# Patient Record
Sex: Female | Born: 1945 | Race: White | Hispanic: No | Smoking: Current every day smoker
Health system: Southern US, Community
[De-identification: ages and names within clinical notes are randomized; demographics above are authoritative.]

## PROBLEM LIST (undated history)

## (undated) DIAGNOSIS — E7800 Pure hypercholesterolemia, unspecified: Secondary | ICD-10-CM

## (undated) DIAGNOSIS — M1990 Unspecified osteoarthritis, unspecified site: Secondary | ICD-10-CM

## (undated) DIAGNOSIS — Z8719 Personal history of other diseases of the digestive system: Secondary | ICD-10-CM

## (undated) DIAGNOSIS — K219 Gastro-esophageal reflux disease without esophagitis: Secondary | ICD-10-CM

## (undated) DIAGNOSIS — I10 Essential (primary) hypertension: Secondary | ICD-10-CM

## (undated) DIAGNOSIS — J45909 Unspecified asthma, uncomplicated: Secondary | ICD-10-CM

## (undated) DIAGNOSIS — J189 Pneumonia, unspecified organism: Secondary | ICD-10-CM

## (undated) DIAGNOSIS — H269 Unspecified cataract: Secondary | ICD-10-CM

## (undated) DIAGNOSIS — E119 Type 2 diabetes mellitus without complications: Secondary | ICD-10-CM

## (undated) HISTORY — PX: ABDOMINAL HYSTERECTOMY: SHX81

## (undated) HISTORY — PX: CHOLECYSTECTOMY: SHX55

## (undated) HISTORY — PX: ORIF ANKLE FRACTURE: SUR919

---
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
## 2018-07-10 DIAGNOSIS — F411 Generalized anxiety disorder: Secondary | ICD-10-CM | POA: Insufficient documentation

## 2018-07-10 DIAGNOSIS — K219 Gastro-esophageal reflux disease without esophagitis: Secondary | ICD-10-CM | POA: Insufficient documentation

## 2018-07-10 DIAGNOSIS — J301 Allergic rhinitis due to pollen: Secondary | ICD-10-CM | POA: Insufficient documentation

## 2018-07-10 DIAGNOSIS — M05712 Rheumatoid arthritis with rheumatoid factor of left shoulder without organ or systems involvement: Secondary | ICD-10-CM | POA: Insufficient documentation

## 2018-07-10 DIAGNOSIS — Z8711 Personal history of peptic ulcer disease: Secondary | ICD-10-CM | POA: Insufficient documentation

## 2018-07-10 DIAGNOSIS — N3281 Overactive bladder: Secondary | ICD-10-CM | POA: Insufficient documentation

## 2018-07-10 DIAGNOSIS — K449 Diaphragmatic hernia without obstruction or gangrene: Secondary | ICD-10-CM | POA: Insufficient documentation

## 2018-07-10 DIAGNOSIS — E119 Type 2 diabetes mellitus without complications: Secondary | ICD-10-CM | POA: Insufficient documentation

## 2018-07-10 DIAGNOSIS — I10 Essential (primary) hypertension: Secondary | ICD-10-CM | POA: Insufficient documentation

## 2018-07-10 DIAGNOSIS — E7800 Pure hypercholesterolemia, unspecified: Secondary | ICD-10-CM | POA: Insufficient documentation

## 2018-07-10 DIAGNOSIS — M05711 Rheumatoid arthritis with rheumatoid factor of right shoulder without organ or systems involvement: Secondary | ICD-10-CM | POA: Insufficient documentation

## 2018-07-10 DIAGNOSIS — J4540 Moderate persistent asthma, uncomplicated: Secondary | ICD-10-CM | POA: Insufficient documentation

## 2018-08-29 DIAGNOSIS — Z79899 Other long term (current) drug therapy: Secondary | ICD-10-CM | POA: Insufficient documentation

## 2018-08-29 DIAGNOSIS — G8929 Other chronic pain: Secondary | ICD-10-CM | POA: Insufficient documentation

## 2018-09-14 DIAGNOSIS — G8929 Other chronic pain: Secondary | ICD-10-CM | POA: Insufficient documentation

## 2018-12-28 ENCOUNTER — Other Ambulatory Visit: Payer: Self-pay | Admitting: Podiatry

## 2018-12-28 DIAGNOSIS — D489 Neoplasm of uncertain behavior, unspecified: Secondary | ICD-10-CM

## 2019-01-04 ENCOUNTER — Other Ambulatory Visit: Payer: Self-pay

## 2019-01-04 ENCOUNTER — Ambulatory Visit
Admission: RE | Admit: 2019-01-04 | Discharge: 2019-01-04 | Disposition: A | Discharge status: Home / Self Care | Payer: Medicare HMO | Source: Ambulatory Visit | Attending: Podiatry | Admitting: Podiatry

## 2019-01-04 DIAGNOSIS — D489 Neoplasm of uncertain behavior, unspecified: Secondary | ICD-10-CM | POA: Diagnosis not present

## 2019-01-04 HISTORY — DX: Type 2 diabetes mellitus without complications: E11.9

## 2019-01-04 HISTORY — DX: Essential (primary) hypertension: I10

## 2019-01-04 IMAGING — MR MR FOOT*L* WO/W CM
10 series · 40 of 40 positions shown · IV contrast (9ml Gadavist)
Comparison: None.

CLINICAL DATA: Enlarging palpable nodules along the plantar aspect
of the foot for the past year. Patient reports rheumatoid nodules.
History of little toe surgery. No acute injury.

EXAM:
MRI OF THE LEFT FOREFOOT WITHOUT AND WITH CONTRAST
TECHNIQUE: Multiplanar, multisequence MR imaging of the left foot was performed
both before and after administration of intravenous contrast.
CONTRAST:  9mL GADAVIST GADOBUTROL 1 MMOL/ML IV SOLN

[Series 6: T2 fat-sat · coronal · left · 3.0mm · 0.47mm/px · 5 of 64 slices shown (1 of 2)]
[im 1/64]
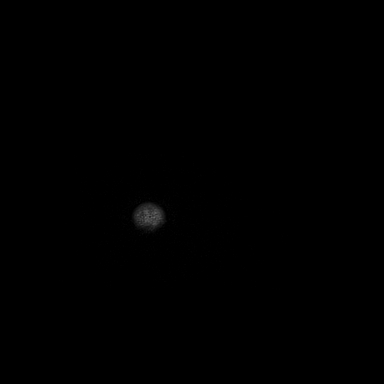
[im 16/64]
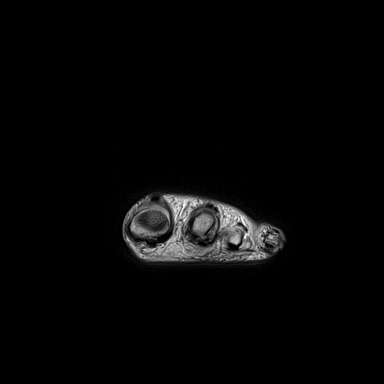
[im 32/64]
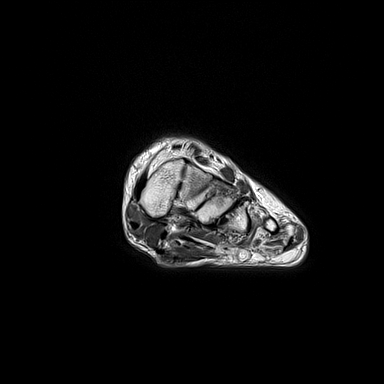
[im 48/64]
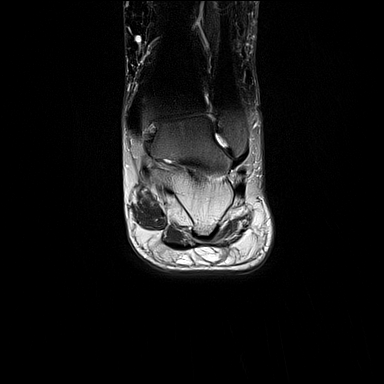
[im 64/64]
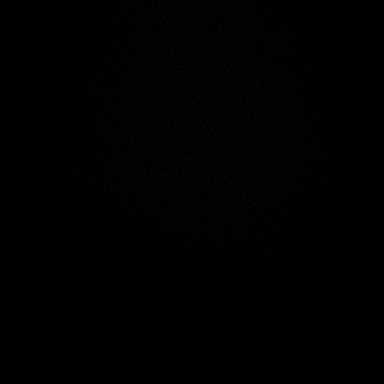

[Series 7: T1 · axial · left · 3.0mm · 0.68mm/px · z∈[-104,+11]mm · 2 of 30 slices shown (1 of 2)]
[im 1/30]
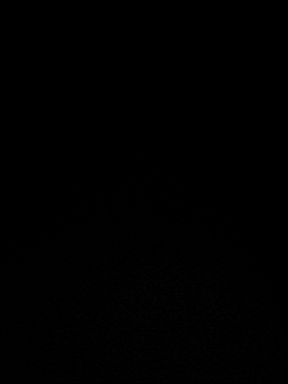
[im 30/30]
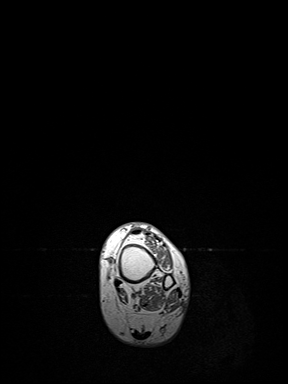

[Series 8: T1 · coronal · left · 3.0mm · 0.47mm/px · 5 of 64 slices shown (2 of 2)]
[im 1/64]
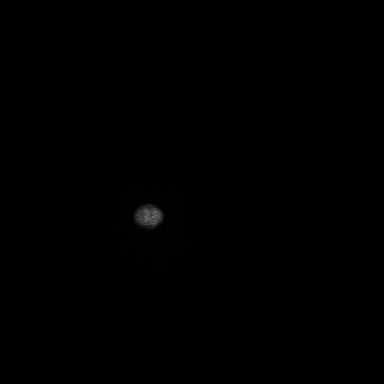
[im 16/64]
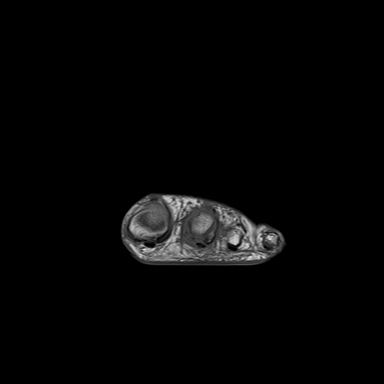
[im 32/64]
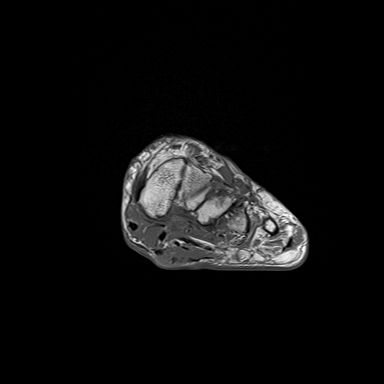
[im 48/64]
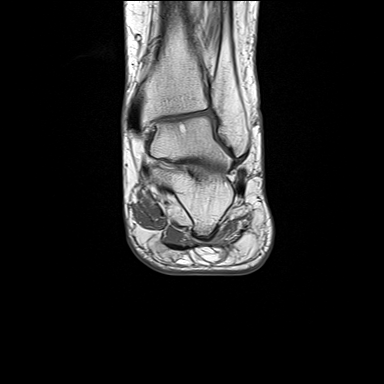
[im 64/64]
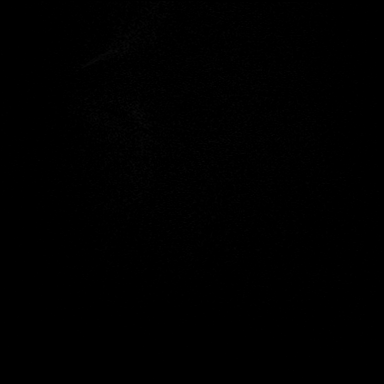

[Series 9: T2 fat-sat · axial · left · 3.0mm · 0.77mm/px · z∈[-104,+11]mm · 3 of 30 slices shown (2 of 2)]
[im 1/30]
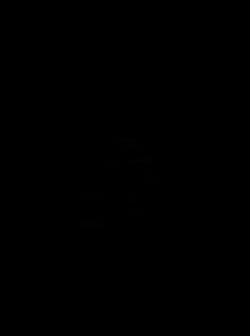
[im 15/30]
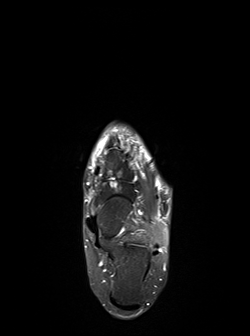
[im 30/30]
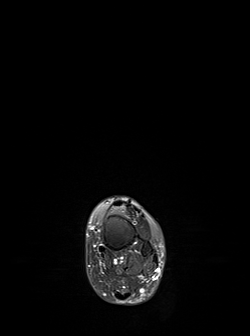

[Series 10: T1 fat-sat · coronal · non-contrast · left · 3.0mm · 0.59mm/px · 6 of 64 slices shown (1 of 3)]
[im 1/64]
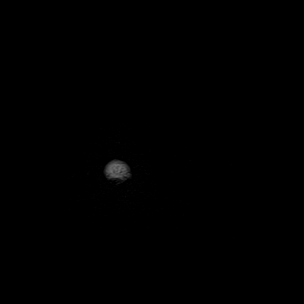
[im 13/64]
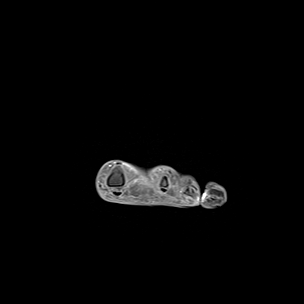
[im 26/64]
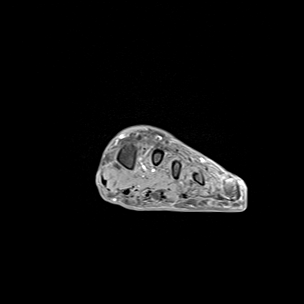
[im 38/64]
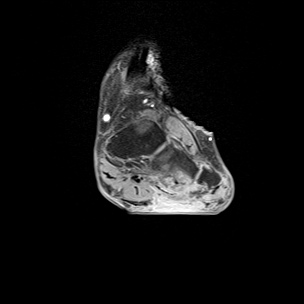
[im 51/64]
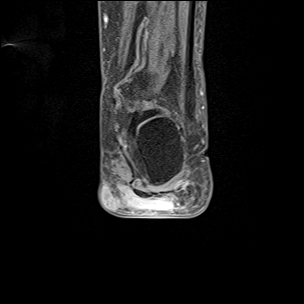
[im 64/64]
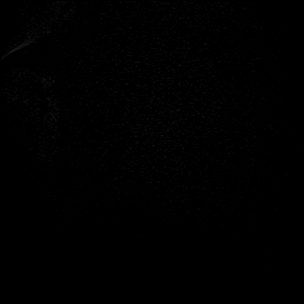

[Series 11: STIR · sagittal · left · 3.0mm · 0.90mm/px · 2 of 27 slices shown (1 of 2)]
[im 1/27]
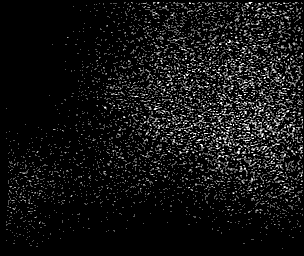
[im 27/27]
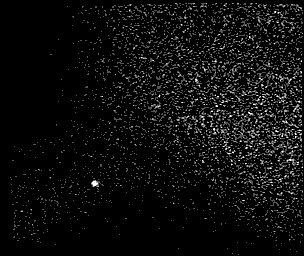

[Series 12: STIR · coronal · left · 3.0mm · 0.70mm/px · 6 of 64 slices shown (2 of 2)]
[im 1/64]
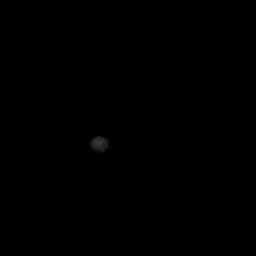
[im 13/64]
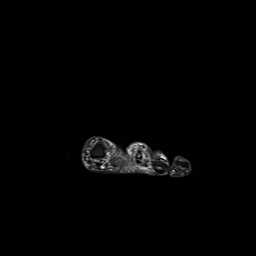
[im 26/64]
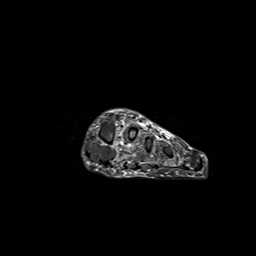
[im 38/64]
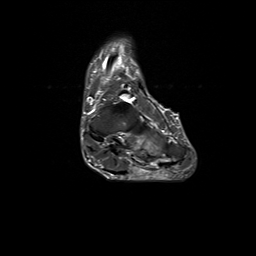
[im 51/64]
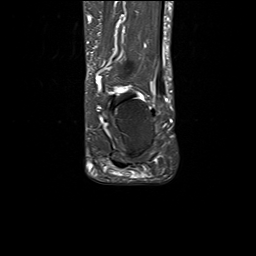
[im 64/64]
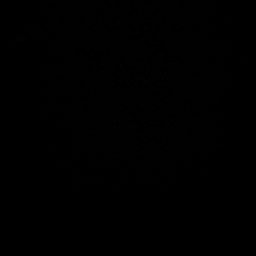

[Series 13: T1 fat-sat post-contrast · coronal · left · 3.0mm · 0.59mm/px · 6 of 64 slices shown]
[im 1/64]
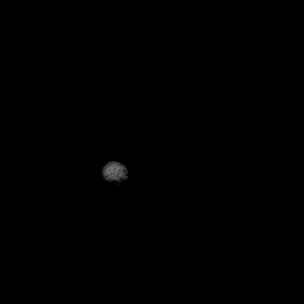
[im 13/64]
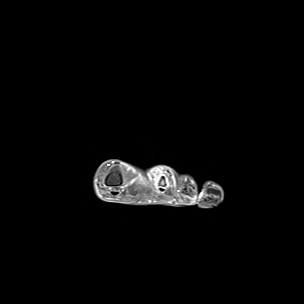
[im 26/64]
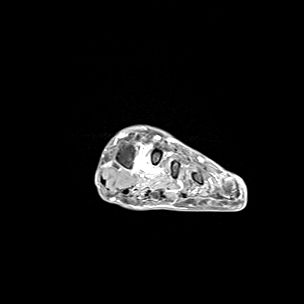
[im 38/64]
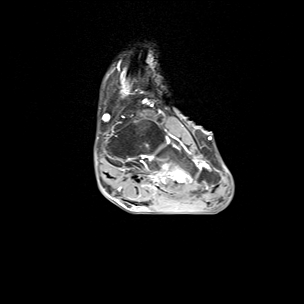
[im 51/64]
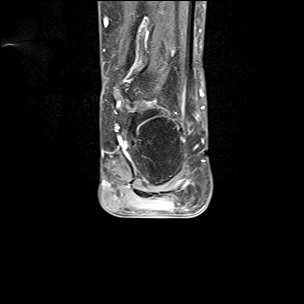
[im 64/64]
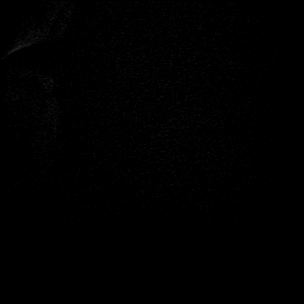

[Series 14: T1 fat-sat · axial · left · 3.0mm · 0.81mm/px · z∈[-104,+11]mm · 3 of 30 slices shown (2 of 3)]
[im 1/30]
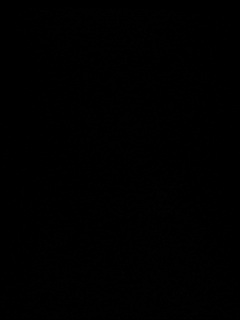
[im 15/30]
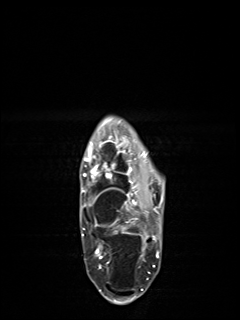
[im 30/30]
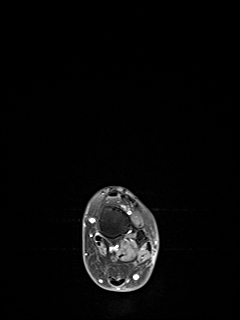

[Series 15: T1 fat-sat · sagittal · left · 3.0mm · 0.76mm/px · 2 of 27 slices shown (3 of 3)]
[im 1/27]
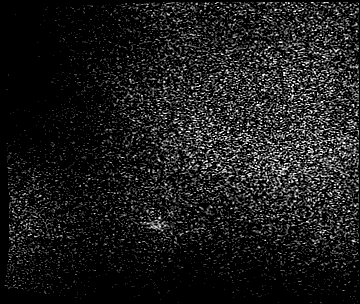
[im 27/27]
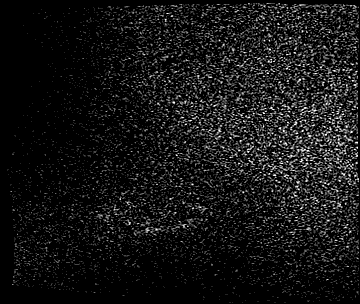

[40 of 40 positions shown; findings below may reference images not displayed]

FINDINGS: Bones/Joint/Cartilage

The study includes the entire foot and ankle. The ankle and hindfoot
appear unremarkable. Advanced arthropathic changes are present
throughout the midfoot with subchondral edema, enhancement and cyst
formation throughout the cuneiform bones, cuboid and metatarsal
bases. There is surrounding synovial enhancement. In addition, there
is focal arthropathy at the 2nd metatarsophalangeal joint with
marrow edema and enhancement in the 2nd metatarsal head and proximal
phalangeal base. There is prominent synovial and surrounding soft
tissue enhancement at that joint. The additional metatarsophalangeal
and interphalangeal joints appear normal.

Ligaments

The Lisfranc ligament is intact. The collateral ligaments of the
metatarsophalangeal joints appear intact.

Muscles and Tendons

There is mild peroneal tenosynovitis with synovial enhancement
following contrast. More advanced tendinosis and tenosynovitis of
the tibialis anterior tendon is demonstrated, best seen on the
postcontrast axial images (images 9 through 12 of series 14). The
additional ankle tendons appear normal. The forefoot tendons and
muscles appear normal.

Soft tissues

There are multiple infiltrating subcutaneous masses within the
plantar subcutaneous fat of the foot. These are most clearly
demonstrated on the T1 weighted images and are nearly isointense to
muscle on those sequences. The lesions demonstrate heterogeneous T2
signal and variable but predominantly low level enhancement
following contrast. Plantar to the calcaneus, there is a lesion
measuring 4.5 x 4.6 cm on image [DATE]. Plantar to the Lisfranc joint,
there is a lesion measuring 3.8 x 2.7 cm on image [DATE]. There is an
infiltrating lesion medial to this which involves the distal aspect
of the plantar fascia and shows more intense enhancement following
contrast. Other smaller lesions are present plantar to the 5th
metatarsophalangeal joint, the distal phalanx of the great toe and
the distal phalanx of the 2nd toe. The subcutaneous lesions do not
appear to be associated with any osseous erosion.
IMPRESSION: 1. Multiple subcutaneous nodules in left foot with homogeneous T1
signal, heterogeneous T2 signal and predominately low level
enhancement following contrast. These are most compatible with
rheumatoid nodules. In addition, there may be plantar fibromatosis.
2. Advanced midfoot arthropathy, likely rheumatoid arthritis. A
neuropathic joint could have this appearance.
3. Additional arthropathy at the 2nd metatarsophalangeal joint with
prominent synovial enhancement.
4. Tenosynovitis involving the peroneal and tibialis anterior
tendons.

## 2019-01-04 MED ORDER — GADOBUTROL 1 MMOL/ML IV SOLN
9.0000 mL | Freq: Once | INTRAVENOUS | Status: AC | PRN
  Administered 2019-01-04: 9 mL via INTRAVENOUS

## 2019-01-19 DIAGNOSIS — H269 Unspecified cataract: Secondary | ICD-10-CM

## 2019-01-19 HISTORY — DX: Unspecified cataract: H26.9

## 2019-01-31 ENCOUNTER — Encounter: Payer: Self-pay | Admitting: *Deleted

## 2019-02-13 ENCOUNTER — Other Ambulatory Visit: Admission: RE | Admit: 2019-02-13 | Payer: Medicare HMO | Source: Ambulatory Visit

## 2019-02-14 ENCOUNTER — Other Ambulatory Visit
Admission: RE | Admit: 2019-02-14 | Discharge: 2019-02-14 | Disposition: A | Discharge status: Home / Self Care | Payer: Medicare HMO | Source: Ambulatory Visit | Attending: Ophthalmology | Admitting: Ophthalmology

## 2019-02-14 ENCOUNTER — Other Ambulatory Visit: Payer: Self-pay

## 2019-02-14 DIAGNOSIS — Z01812 Encounter for preprocedural laboratory examination: Secondary | ICD-10-CM | POA: Diagnosis present

## 2019-02-14 DIAGNOSIS — Z20828 Contact with and (suspected) exposure to other viral communicable diseases: Secondary | ICD-10-CM | POA: Insufficient documentation

## 2019-02-14 LAB — SARS CORONAVIRUS 2 (TAT 6-24 HRS): SARS Coronavirus 2: NEGATIVE

## 2019-02-16 ENCOUNTER — Ambulatory Visit: Payer: Medicare HMO | Admitting: Certified Registered Nurse Anesthetist

## 2019-02-16 ENCOUNTER — Encounter
Admission: RE | Disposition: A | Discharge status: Home / Self Care | Payer: Self-pay | Source: Home / Self Care | Attending: Ophthalmology

## 2019-02-16 ENCOUNTER — Ambulatory Visit
Admission: RE | Admit: 2019-02-16 | Discharge: 2019-02-16 | Disposition: A | Discharge status: Home / Self Care | Payer: Medicare HMO | Attending: Ophthalmology | Admitting: Ophthalmology

## 2019-02-16 ENCOUNTER — Encounter: Payer: Self-pay | Admitting: *Deleted

## 2019-02-16 ENCOUNTER — Other Ambulatory Visit: Payer: Self-pay

## 2019-02-16 DIAGNOSIS — Z791 Long term (current) use of non-steroidal anti-inflammatories (NSAID): Secondary | ICD-10-CM | POA: Diagnosis not present

## 2019-02-16 DIAGNOSIS — M1990 Unspecified osteoarthritis, unspecified site: Secondary | ICD-10-CM | POA: Insufficient documentation

## 2019-02-16 DIAGNOSIS — Z881 Allergy status to other antibiotic agents status: Secondary | ICD-10-CM | POA: Insufficient documentation

## 2019-02-16 DIAGNOSIS — I10 Essential (primary) hypertension: Secondary | ICD-10-CM | POA: Diagnosis not present

## 2019-02-16 DIAGNOSIS — Z7951 Long term (current) use of inhaled steroids: Secondary | ICD-10-CM | POA: Diagnosis not present

## 2019-02-16 DIAGNOSIS — Z880 Allergy status to penicillin: Secondary | ICD-10-CM | POA: Diagnosis not present

## 2019-02-16 DIAGNOSIS — Z79899 Other long term (current) drug therapy: Secondary | ICD-10-CM | POA: Insufficient documentation

## 2019-02-16 DIAGNOSIS — J45909 Unspecified asthma, uncomplicated: Secondary | ICD-10-CM | POA: Insufficient documentation

## 2019-02-16 DIAGNOSIS — M810 Age-related osteoporosis without current pathological fracture: Secondary | ICD-10-CM | POA: Diagnosis not present

## 2019-02-16 DIAGNOSIS — F419 Anxiety disorder, unspecified: Secondary | ICD-10-CM | POA: Diagnosis not present

## 2019-02-16 DIAGNOSIS — H2511 Age-related nuclear cataract, right eye: Secondary | ICD-10-CM | POA: Insufficient documentation

## 2019-02-16 DIAGNOSIS — E7800 Pure hypercholesterolemia, unspecified: Secondary | ICD-10-CM | POA: Diagnosis not present

## 2019-02-16 DIAGNOSIS — F17200 Nicotine dependence, unspecified, uncomplicated: Secondary | ICD-10-CM | POA: Diagnosis not present

## 2019-02-16 DIAGNOSIS — E1136 Type 2 diabetes mellitus with diabetic cataract: Secondary | ICD-10-CM | POA: Diagnosis not present

## 2019-02-16 HISTORY — PX: CATARACT EXTRACTION W/PHACO: SHX586

## 2019-02-16 HISTORY — DX: Pneumonia, unspecified organism: J18.9

## 2019-02-16 HISTORY — DX: Personal history of other diseases of the digestive system: Z87.19

## 2019-02-16 HISTORY — DX: Unspecified asthma, uncomplicated: J45.909

## 2019-02-16 HISTORY — DX: Gastro-esophageal reflux disease without esophagitis: K21.9

## 2019-02-16 HISTORY — DX: Pure hypercholesterolemia, unspecified: E78.00

## 2019-02-16 HISTORY — DX: Unspecified osteoarthritis, unspecified site: M19.90

## 2019-02-16 LAB — GLUCOSE, CAPILLARY
Glucose-Capillary: 133 mg/dL — ABNORMAL HIGH (ref 70–99)
Glucose-Capillary: 132 mg/dL — ABNORMAL HIGH (ref 70–99)

## 2019-02-16 SURGERY — PHACOEMULSIFICATION, CATARACT, WITH IOL INSERTION
Anesthesia: Monitor Anesthesia Care | Site: Eye | Laterality: Right

## 2019-02-16 MED ORDER — TRYPAN BLUE 0.06 % OP SOLN
OPHTHALMIC | Status: DC | PRN
  Administered 2019-02-16: 0.5 mL via INTRAOCULAR

## 2019-02-16 MED ORDER — ARMC OPHTHALMIC DILATING GEL
1.0000 "application " | OPHTHALMIC | Status: AC
  Administered 2019-02-16 (×3): 1 via OPHTHALMIC
  Filled 2019-02-16: qty 0.25

## 2019-02-16 MED ORDER — TETRACAINE HCL 0.5 % OP SOLN
1.0000 [drp] | Freq: Once | OPHTHALMIC | Status: AC
  Administered 2019-02-16 (×2): 1 [drp] via OPHTHALMIC

## 2019-02-16 MED ORDER — BUPIVACAINE HCL (PF) 0.5 % IJ SOLN
INTRAMUSCULAR | Status: AC
  Filled 2019-02-16: qty 30

## 2019-02-16 MED ORDER — DEXMEDETOMIDINE HCL 200 MCG/2ML IV SOLN
INTRAVENOUS | Status: DC | PRN
  Administered 2019-02-16: 4 ug via INTRAVENOUS
  Administered 2019-02-16: 8 ug via INTRAVENOUS

## 2019-02-16 MED ORDER — GELATIN ABSORBABLE 12-7 MM EX MISC
CUTANEOUS | Status: AC
  Filled 2019-02-16: qty 1

## 2019-02-16 MED ORDER — SODIUM CHLORIDE 0.9 % IV SOLN
INTRAVENOUS | Status: DC
  Administered 2019-02-16 (×2): via INTRAVENOUS

## 2019-02-16 MED ORDER — MIDAZOLAM HCL 2 MG/2ML IJ SOLN
INTRAMUSCULAR | Status: AC
  Filled 2019-02-16: qty 2

## 2019-02-16 MED ORDER — LIDOCAINE HCL (PF) 4 % IJ SOLN
INTRAOCULAR | Status: DC | PRN
  Administered 2019-02-16: 4 mL via OPHTHALMIC

## 2019-02-16 MED ORDER — NA HYALUR & NA CHOND-NA HYALUR 0.55-0.5 ML IO KIT
PACK | INTRAOCULAR | Status: DC | PRN
  Administered 2019-02-16: 1 via INTRAOCULAR

## 2019-02-16 MED ORDER — ONDANSETRON HCL 4 MG/2ML IJ SOLN
INTRAMUSCULAR | Status: DC | PRN
  Administered 2019-02-16: 4 mg via INTRAVENOUS

## 2019-02-16 MED ORDER — EPINEPHRINE PF 1 MG/ML IJ SOLN
INTRAOCULAR | Status: DC | PRN
  Administered 2019-02-16: 08:00:00 via OPHTHALMIC

## 2019-02-16 MED ORDER — ARMC OPHTHALMIC DILATING DROPS
OPHTHALMIC | Status: AC
  Filled 2019-02-16: qty 0.5

## 2019-02-16 MED ORDER — PROPOFOL 10 MG/ML IV BOLUS
INTRAVENOUS | Status: AC
  Filled 2019-02-16: qty 20

## 2019-02-16 MED ORDER — FENTANYL CITRATE (PF) 100 MCG/2ML IJ SOLN
INTRAMUSCULAR | Status: DC | PRN
  Administered 2019-02-16 (×4): 25 ug via INTRAVENOUS

## 2019-02-16 MED ORDER — NEOMYCIN-POLYMYXIN B GU 40-200000 IR SOLN
Status: AC
  Filled 2019-02-16: qty 1

## 2019-02-16 MED ORDER — POVIDONE-IODINE 5 % OP SOLN
OPHTHALMIC | Status: DC | PRN
  Administered 2019-02-16: 1 via OPHTHALMIC

## 2019-02-16 MED ORDER — FENTANYL CITRATE (PF) 100 MCG/2ML IJ SOLN
INTRAMUSCULAR | Status: AC
  Filled 2019-02-16: qty 2

## 2019-02-16 MED ORDER — POLYMYXIN B-TRIMETHOPRIM 10000-0.1 UNIT/ML-% OP SOLN: 1.0000 [drp] | Freq: Once | OPHTHALMIC | Status: DC

## 2019-02-16 MED ORDER — POLYMYXIN B-TRIMETHOPRIM 10000-0.1 UNIT/ML-% OP SOLN
OPHTHALMIC | Status: AC
  Filled 2019-02-16: qty 10

## 2019-02-16 MED ORDER — POLYMYXIN B-TRIMETHOPRIM 10000-0.1 UNIT/ML-% OP SOLN
OPHTHALMIC | Status: DC | PRN
  Administered 2019-02-16: 2 [drp]

## 2019-02-16 MED ORDER — NA CHONDROIT SULF-NA HYALURON 40-17 MG/ML IO SOLN
INTRAOCULAR | Status: DC | PRN
  Administered 2019-02-16: 1 mL via INTRAOCULAR

## 2019-02-16 MED ORDER — CARBACHOL 0.01 % IO SOLN
INTRAOCULAR | Status: DC | PRN
  Administered 2019-02-16: 0.5 mL via INTRAOCULAR

## 2019-02-16 MED ORDER — TETRACAINE HCL 0.5 % OP SOLN
OPHTHALMIC | Status: AC
  Administered 2019-02-16: 07:00:00 1 [drp] via OPHTHALMIC
  Filled 2019-02-16: qty 4

## 2019-02-16 SURGICAL SUPPLY — 17 items
DISSECTOR HYDRO NUCLEUS 50X22 (MISCELLANEOUS) ×8 IMPLANT
DRSG TEGADERM 2-3/8X2-3/4 SM (GAUZE/BANDAGES/DRESSINGS) ×2 IMPLANT
GLOVE BIOGEL M 6.5 STRL (GLOVE) ×2 IMPLANT
GOWN STRL REUS W/ TWL LRG LVL3 (GOWN DISPOSABLE) ×1 IMPLANT
GOWN STRL REUS W/ TWL XL LVL3 (GOWN DISPOSABLE) ×1 IMPLANT
GOWN STRL REUS W/TWL LRG LVL3 (GOWN DISPOSABLE) ×1
GOWN STRL REUS W/TWL XL LVL3 (GOWN DISPOSABLE) ×1
KNIFE 45D UP 2.3 (MISCELLANEOUS) ×2 IMPLANT
LABEL CATARACT MEDS ST (LABEL) ×2 IMPLANT
LENS IOL TECNIS ITEC 22.5 (Intraocular Lens) ×1 IMPLANT
PACK CATARACT (MISCELLANEOUS) ×2 IMPLANT
PACK CATARACT KING (MISCELLANEOUS) ×2 IMPLANT
PACK EYE AFTER SURG (MISCELLANEOUS) ×2 IMPLANT
SOL BSS BAG (MISCELLANEOUS) ×2
SOLUTION BSS BAG (MISCELLANEOUS) ×1 IMPLANT
WATER STERILE IRR 250ML POUR (IV SOLUTION) ×2 IMPLANT
WIPE NON LINTING 3.25X3.25 (MISCELLANEOUS) ×2 IMPLANT

## 2019-02-16 NOTE — Anesthesia Post-op Follow-up Note (Signed)
Anesthesia QCDR form completed.        

## 2019-02-16 NOTE — Transfer of Care (Signed)
Immediate Anesthesia Transfer of Care Note  Patient: Taylor George  Procedure(s) Performed: CATARACT EXTRACTION PHACO AND INTRAOCULAR LENS PLACEMENT ([place]), RIGHT, (Right Eye)  Patient Location: PACU  Anesthesia Type:MAC  Level of Consciousness: awake, alert  and oriented  Airway & Oxygen Therapy: Patient Spontanous Breathing and Patient connected to nasal cannula oxygen  Post-op Assessment: Report given to RN and Post -op Vital signs reviewed and stable  Post vital signs: Reviewed and stable  Last Vitals:  Vitals Value Taken Time  BP    Temp    Pulse    Resp    SpO2      Last Pain:  Vitals:   02/16/19 0618  TempSrc: Tympanic  PainSc: 0-No pain         Complications: No apparent anesthesia complications

## 2019-02-16 NOTE — H&P (Signed)
   I have reviewed the patient's H&P and agree with its findings. There have been no interval changes.  Tyrina Hines MD Ophthalmology 

## 2019-02-16 NOTE — Discharge Instructions (Addendum)
Eye Surgery Discharge Instructions  Expect mild scratchy sensation or mild soreness. DO NOT RUB YOUR EYE!  The day of surgery:  Minimal physical activity, but bed rest is not required  No reading, computer work, or close hand work  No bending, lifting, or straining.  May watch TV  For 24 hours:  No driving, legal decisions, or alcoholic beverages  Safety precautions  Eat anything you prefer: It is better to start with liquids, then soup then solid foods.  Solar shield eyeglasses should be worn for comfort in the sunlight/patch while sleeping  Resume all regular medications including aspirin or Coumadin if these were discontinued prior to surgery. You may shower, bathe, shave, or wash your hair. Tylenol may be taken for mild discomfort. Follow eye drop instruction sheet as reviewed.  Call your doctor if you experience significant pain, nausea, or vomiting, fever > 101 or other signs of infection. [phone] or [phone] Specific instructions:  Follow-up Information    Marchia Meiers, MD Follow up.   Specialty: Ophthalmology Why: 02-17-19 @ 10:45 am Contact information: [place] [address] [phone]

## 2019-02-16 NOTE — Op Note (Signed)
  PREOPERATIVE DIAGNOSIS:  Nuclear sclerotic cataract of the RIGHT eye.   POSTOPERATIVE DIAGNOSIS:  Nuclear sclerotic cataract of the RIGHT eye.   OPERATIVE PROCEDURE: Cataract surgery OD   SURGEON:  Marchia Meiers, MD.   ANESTHESIA:  Anesthesiologist: Alphonsus Sias, MD CRNA: Willette Alma, CRNA  1.      Managed anesthesia care. 2.     0.50ml of Shugarcaine was instilled following the paracentesis   COMPLICATIONS:  None.   TECHNIQUE:   Divide and conquer   DESCRIPTION OF PROCEDURE:  The patient was examined and consented in the preoperative holding area where the aforementioned topical anesthesia was applied to the RIGHT eye and then brought back to the Operating Room where the RIGHT eye was prepped and draped in the usual sterile ophthalmic fashion and a lid speculum was placed. A paracentesis was created with the side port blade, the anterior chamber was washed out with trypan blue to stain the anterior capsule, and the anterior chamber was filled with viscoelastic. A near clear corneal incision was performed with the steel keratome. A continuous curvilinear capsulorrhexis was performed with a cystotome followed by the capsulorrhexis forceps. Hydrodissection and hydrodelineation were carried out with BSS on a blunt cannula. The lens was removed in a divide and conquer  technique and the remaining cortical material was removed with the irrigation-aspiration handpiece. The capsular bag was inflated with viscoelastic and the lens was placed in the capsular bag without complication. The remaining viscoelastic was removed from the eye with the irrigation-aspiration handpiece. The wounds were hydrated. The anterior chamber was flushed and the eye was inflated to physiologic pressure. 0.32ml Vigamox was placed in the anterior chamber. The wounds were found to be water tight. The eye was dressed with Vigamox. The patient was given protective glasses to wear throughout the day and a shield with which  to sleep tonight. The patient was also given drops with which to begin a drop regimen today and will follow-up with me in one day. Implant Name Type Inv. Item Serial No. Manufacturer Lot No. LRB No. Used Action  LENS IOL DIOP 22.5 - [id number] Intraocular Lens LENS IOL DIOP 22.5 [id number] 2002 [place]  Right 1 Implanted    Procedure(s) with comments: CATARACT EXTRACTION PHACO AND INTRAOCULAR LENS PLACEMENT ([facility]), RIGHT, (Right) - [place] 00:53.8 CDE 8.61 FLUID PACK LOT # [id number]  Electronically signed: Marchia Meiers 02/16/2019 12:31 PM

## 2019-02-16 NOTE — Anesthesia Preprocedure Evaluation (Addendum)
Anesthesia Evaluation  Patient identified by MRN, date of birth, ID band Patient awake    Reviewed: Allergy & Precautions, H&P , NPO status , reviewed documented beta blocker date and time   Airway Mallampati: III  TM Distance: >3 FB Neck ROM: full    Dental  (+) Upper Dentures, Lower Dentures   Pulmonary asthma , pneumonia, resolved, Current Smoker and Patient abstained from smoking.,    Pulmonary exam normal        Cardiovascular hypertension, Normal cardiovascular exam     Neuro/Psych    GI/Hepatic hiatal hernia, GERD  Medicated and Controlled,  Endo/Other  diabetes  Renal/GU      Musculoskeletal  (+) Arthritis ,   Abdominal   Peds  Hematology   Anesthesia Other Findings Past Medical History: No date: Arthritis     Comment:  osteoporosis No date: Asthma 01/2019: Cataract     Comment:  right eye No date: Diabetes mellitus without complication (HCC) No date: GERD (gastroesophageal reflux disease)     Comment:  ulcers No date: History of hiatal hernia No date: Hypercholesteremia No date: Hypertension No date: Pneumonia     Comment:  bronchitis also, both in the past  Past Surgical History: No date: ABDOMINAL HYSTERECTOMY No date: CHOLECYSTECTOMY No date: ORIF ANKLE FRACTURE; Right     Comment:  pins foot and toes, also, ORIF RIGHT heel  BMI    Body Mass Index: 39.57 kg/m      Reproductive/Obstetrics                            Anesthesia Physical Anesthesia Plan  ASA: III  Anesthesia Plan: MAC   Post-op Pain Management:    Induction: Intravenous  PONV Risk Score and Plan: Treatment may vary due to age or medical condition and TIVA  Airway Management Planned: Nasal Cannula and Natural Airway  Additional Equipment:   Intra-op Plan:   Post-operative Plan:   Informed Consent: I have reviewed the patients History and Physical, chart, labs and discussed the  procedure including the risks, benefits and alternatives for the proposed anesthesia with the patient or authorized representative who has indicated his/her understanding and acceptance.     Dental Advisory Given  Plan Discussed with: CRNA  Anesthesia Plan Comments:         Anesthesia Quick Evaluation

## 2019-02-17 ENCOUNTER — Encounter: Payer: Self-pay | Admitting: Ophthalmology

## 2019-02-17 NOTE — Anesthesia Postprocedure Evaluation (Signed)
Anesthesia Post Note  Patient: Taylor George  Procedure(s) Performed: CATARACT EXTRACTION PHACO AND INTRAOCULAR LENS PLACEMENT ([place]), RIGHT, (Right Eye)  Patient location during evaluation: Phase II Anesthesia Type: MAC Level of consciousness: awake and alert Pain management: pain level controlled Vital Signs Assessment: post-procedure vital signs reviewed and stable Respiratory status: spontaneous breathing, nonlabored ventilation and respiratory function stable Cardiovascular status: stable and blood pressure returned to baseline Postop Assessment: no apparent nausea or vomiting Anesthetic complications: no     Last Vitals:  Vitals:   02/16/19 0618 02/16/19 0809  BP: 134/77 110/65  Pulse: 99 83  Resp: 18 16  Temp: (!) 36.3 C (!) 36.1 C  SpO2: 98% 97%    Last Pain:  Vitals:   02/16/19 0809  TempSrc: Temporal  PainSc: 0-No pain                 Alphonsus Sias

## 2019-02-20 ENCOUNTER — Other Ambulatory Visit: Payer: Self-pay

## 2019-02-21 DIAGNOSIS — Z6841 Body Mass Index (BMI) 40.0 and over, adult: Secondary | ICD-10-CM | POA: Insufficient documentation

## 2019-02-22 ENCOUNTER — Other Ambulatory Visit: Payer: Self-pay | Admitting: Family Medicine

## 2019-02-22 DIAGNOSIS — Z1231 Encounter for screening mammogram for malignant neoplasm of breast: Secondary | ICD-10-CM

## 2019-02-23 ENCOUNTER — Other Ambulatory Visit: Payer: Self-pay | Admitting: Podiatry

## 2019-02-23 NOTE — Discharge Instructions (Signed)
[facility] °[facility] ° °POST OPERATIVE INSTRUCTIONS FOR DR. TROXLER, DR. FOWLER, AND DR. BAKER °[facility] ° ° °1. Take your medication as prescribed.  Pain medication should be taken only as needed. ° °2. Keep the dressing clean, dry and intact. ° °3. Keep your foot elevated above the heart level for the first 48 hours. ° °4. Walking to the bathroom and brief periods of walking are acceptable, unless we have instructed you to be non-weight bearing. ° °5. Always wear your post-op shoe when walking.  Always use your crutches if you are to be non-weight bearing. ° °6. Do not take a shower. Baths are permissible as long as the foot is kept out of the water.  ° °7. Every hour you are awake:  °- Bend your knee 15 times. °- Flex foot 15 times °- Massage calf 15 times ° °8. Call [facility] ([phone]) if any of the following problems occur: °- You develop a temperature or fever. °- The bandage becomes saturated with blood. °- Medication does not stop your pain. °- Injury of the foot occurs. °- Any symptoms of infection including redness, odor, or red streaks running from wound. ° °General Anesthesia, Adult, Care After °This sheet gives you information about how to care for yourself after your procedure. Your health care provider may also give you more specific instructions. If you have problems or questions, contact your health care provider. °What can I expect after the procedure? °After the procedure, the following side effects are common: °· Pain or discomfort at the IV site. °· Nausea. °· Vomiting. °· Sore throat. °· Trouble concentrating. °· Feeling cold or chills. °· Weak or tired. °· Sleepiness and fatigue. °· Soreness and body aches. These side effects can affect parts of the body that were not involved in surgery. °Follow these instructions at home: ° °For at least 24 hours after the procedure: °· Have a responsible adult stay with you. It is  important to have someone help care for you until you are awake and alert. °· Rest as needed. °· Do not: °? Participate in activities in which you could fall or become injured. °? Drive. °? Use heavy machinery. °? Drink alcohol. °? Take sleeping pills or medicines that cause drowsiness. °? Make important decisions or sign legal documents. °? Take care of children on your own. °Eating and drinking °· Follow any instructions from your health care provider about eating or drinking restrictions. °· When you feel hungry, start by eating small amounts of foods that are soft and easy to digest (bland), such as toast. Gradually return to your regular diet. °· Drink enough fluid to keep your urine pale yellow. °· If you vomit, rehydrate by drinking water, juice, or clear broth. °General instructions °· If you have sleep apnea, surgery and certain medicines can increase your risk for breathing problems. Follow instructions from your health care provider about wearing your sleep device: °? Anytime you are sleeping, including during daytime naps. °? While taking prescription pain medicines, sleeping medicines, or medicines that make you drowsy. °· Return to your normal activities as told by your health care provider. Ask your health care provider what activities are safe for you. °· Take over-the-counter and prescription medicines only as told by your health care provider. °· If you smoke, do not smoke without supervision. °· Keep all follow-up visits as told by your health care provider. This is important. °Contact a health care provider if: °· You   have nausea or vomiting that does not get better with medicine. °· You cannot eat or drink without vomiting. °· You have pain that does not get better with medicine. °· You are unable to pass urine. °· You develop a skin rash. °· You have a fever. °· You have redness around your IV site that gets worse. °Get help right away if: °· You have difficulty breathing. °· You have chest  pain. °· You have blood in your urine or stool, or you vomit blood. °Summary °· After the procedure, it is common to have a sore throat or nausea. It is also common to feel tired. °· Have a responsible adult stay with you for the first 24 hours after general anesthesia. It is important to have someone help care for you until you are awake and alert. °· When you feel hungry, start by eating small amounts of foods that are soft and easy to digest (bland), such as toast. Gradually return to your regular diet. °· Drink enough fluid to keep your urine pale yellow. °· Return to your normal activities as told by your health care provider. Ask your health care provider what activities are safe for you. °This information is not intended to replace advice given to you by your health care provider. Make sure you discuss any questions you have with your health care provider. °Document Released: 07/13/2000 Document Revised: 04/09/2017 Document Reviewed: 11/20/2016 °Elsevier Patient Education © 2020 [employer] ° °

## 2019-02-24 ENCOUNTER — Other Ambulatory Visit
Admission: RE | Admit: 2019-02-24 | Discharge: 2019-02-24 | Disposition: A | Discharge status: Home / Self Care | Payer: Medicare HMO | Source: Ambulatory Visit | Attending: Podiatry | Admitting: Podiatry

## 2019-02-24 ENCOUNTER — Other Ambulatory Visit: Payer: Self-pay

## 2019-02-24 DIAGNOSIS — Z01812 Encounter for preprocedural laboratory examination: Secondary | ICD-10-CM | POA: Diagnosis present

## 2019-02-24 DIAGNOSIS — Z20828 Contact with and (suspected) exposure to other viral communicable diseases: Secondary | ICD-10-CM | POA: Diagnosis not present

## 2019-02-24 LAB — SARS CORONAVIRUS 2 (TAT 6-24 HRS): SARS Coronavirus 2: NEGATIVE

## 2019-03-01 ENCOUNTER — Ambulatory Visit
Admission: RE | Admit: 2019-03-01 | Discharge: 2019-03-01 | Disposition: A | Discharge status: Home / Self Care | Payer: Medicare HMO | Attending: Podiatry | Admitting: Podiatry

## 2019-03-01 ENCOUNTER — Other Ambulatory Visit: Payer: Self-pay

## 2019-03-01 ENCOUNTER — Ambulatory Visit: Payer: Medicare HMO | Admitting: Anesthesiology

## 2019-03-01 ENCOUNTER — Encounter
Admission: RE | Disposition: A | Discharge status: Home / Self Care | Payer: Self-pay | Source: Home / Self Care | Attending: Podiatry

## 2019-03-01 DIAGNOSIS — J45909 Unspecified asthma, uncomplicated: Secondary | ICD-10-CM | POA: Diagnosis not present

## 2019-03-01 DIAGNOSIS — F329 Major depressive disorder, single episode, unspecified: Secondary | ICD-10-CM | POA: Diagnosis not present

## 2019-03-01 DIAGNOSIS — R2242 Localized swelling, mass and lump, left lower limb: Secondary | ICD-10-CM | POA: Diagnosis not present

## 2019-03-01 DIAGNOSIS — M069 Rheumatoid arthritis, unspecified: Secondary | ICD-10-CM | POA: Diagnosis not present

## 2019-03-01 DIAGNOSIS — Z791 Long term (current) use of non-steroidal anti-inflammatories (NSAID): Secondary | ICD-10-CM | POA: Diagnosis not present

## 2019-03-01 DIAGNOSIS — K219 Gastro-esophageal reflux disease without esophagitis: Secondary | ICD-10-CM | POA: Diagnosis not present

## 2019-03-01 DIAGNOSIS — I10 Essential (primary) hypertension: Secondary | ICD-10-CM | POA: Insufficient documentation

## 2019-03-01 DIAGNOSIS — F411 Generalized anxiety disorder: Secondary | ICD-10-CM | POA: Insufficient documentation

## 2019-03-01 DIAGNOSIS — Z7951 Long term (current) use of inhaled steroids: Secondary | ICD-10-CM | POA: Diagnosis not present

## 2019-03-01 DIAGNOSIS — E7800 Pure hypercholesterolemia, unspecified: Secondary | ICD-10-CM | POA: Insufficient documentation

## 2019-03-01 DIAGNOSIS — E785 Hyperlipidemia, unspecified: Secondary | ICD-10-CM | POA: Diagnosis not present

## 2019-03-01 DIAGNOSIS — Z79899 Other long term (current) drug therapy: Secondary | ICD-10-CM | POA: Insufficient documentation

## 2019-03-01 DIAGNOSIS — F17210 Nicotine dependence, cigarettes, uncomplicated: Secondary | ICD-10-CM | POA: Insufficient documentation

## 2019-03-01 DIAGNOSIS — E119 Type 2 diabetes mellitus without complications: Secondary | ICD-10-CM | POA: Insufficient documentation

## 2019-03-01 DIAGNOSIS — Z6841 Body Mass Index (BMI) 40.0 and over, adult: Secondary | ICD-10-CM | POA: Diagnosis not present

## 2019-03-01 HISTORY — PX: WOUND EXPLORATION: SHX6188

## 2019-03-01 LAB — GLUCOSE, CAPILLARY
Glucose-Capillary: 134 mg/dL — ABNORMAL HIGH (ref 70–99)
Glucose-Capillary: 138 mg/dL — ABNORMAL HIGH (ref 70–99)

## 2019-03-01 SURGERY — WOUND EXPLORATION
Anesthesia: General | Site: Foot | Laterality: Left

## 2019-03-01 MED ORDER — GLYCOPYRROLATE 0.2 MG/ML IJ SOLN
INTRAMUSCULAR | Status: DC | PRN
  Administered 2019-03-01: 0.1 mg via INTRAVENOUS

## 2019-03-01 MED ORDER — PROPOFOL 10 MG/ML IV BOLUS
INTRAVENOUS | Status: DC | PRN
  Administered 2019-03-01: 100 mg via INTRAVENOUS

## 2019-03-01 MED ORDER — OXYCODONE-ACETAMINOPHEN 5-325 MG PO TABS: 1.0000 | ORAL_TABLET | Freq: Four times a day (QID) | ORAL | 0 refills | Status: DC | PRN

## 2019-03-01 MED ORDER — ONDANSETRON HCL 4 MG/2ML IJ SOLN
INTRAMUSCULAR | Status: DC | PRN
  Administered 2019-03-01: 4 mg via INTRAVENOUS

## 2019-03-01 MED ORDER — ONDANSETRON HCL 4 MG/2ML IJ SOLN: 4.0000 mg | Freq: Once | INTRAMUSCULAR | Status: DC | PRN

## 2019-03-01 MED ORDER — EPHEDRINE SULFATE 50 MG/ML IJ SOLN
INTRAMUSCULAR | Status: DC | PRN
  Administered 2019-03-01 (×5): 5 mg via INTRAVENOUS
  Administered 2019-03-01: 10 mg via INTRAVENOUS

## 2019-03-01 MED ORDER — POVIDONE-IODINE 7.5 % EX SOLN
Freq: Once | CUTANEOUS | Status: AC
  Administered 2019-03-01: 12:00:00 via TOPICAL

## 2019-03-01 MED ORDER — FENTANYL CITRATE (PF) 100 MCG/2ML IJ SOLN
INTRAMUSCULAR | Status: DC | PRN
  Administered 2019-03-01: 25 ug via INTRAVENOUS
  Administered 2019-03-01: 50 ug via INTRAVENOUS
  Administered 2019-03-01: 25 ug via INTRAVENOUS

## 2019-03-01 MED ORDER — DEXAMETHASONE SODIUM PHOSPHATE 4 MG/ML IJ SOLN
INTRAMUSCULAR | Status: DC | PRN
  Administered 2019-03-01: 4 mg via INTRAVENOUS

## 2019-03-01 MED ORDER — MIDAZOLAM HCL 5 MG/5ML IJ SOLN
INTRAMUSCULAR | Status: DC | PRN
  Administered 2019-03-01: 2 mg via INTRAVENOUS

## 2019-03-01 MED ORDER — LIDOCAINE HCL (CARDIAC) PF 100 MG/5ML IV SOSY
PREFILLED_SYRINGE | INTRAVENOUS | Status: DC | PRN
  Administered 2019-03-01: 50 mg via INTRATRACHEAL

## 2019-03-01 MED ORDER — BUPIVACAINE-EPINEPHRINE 0.5% -1:200000 IJ SOLN
INTRAMUSCULAR | Status: DC | PRN
  Administered 2019-03-01: 17 mL

## 2019-03-01 MED ORDER — FENTANYL CITRATE (PF) 100 MCG/2ML IJ SOLN: 25.0000 ug | INTRAMUSCULAR | Status: DC | PRN

## 2019-03-01 MED ORDER — LACTATED RINGERS IV SOLN
10.0000 mL/h | INTRAVENOUS | Status: DC
  Administered 2019-03-01: 10 mL/h via INTRAVENOUS

## 2019-03-01 MED ORDER — CLINDAMYCIN PHOSPHATE 900 MG/50ML IV SOLN
900.0000 mg | INTRAVENOUS | Status: AC
  Administered 2019-03-01: 900 mg via INTRAVENOUS

## 2019-03-01 SURGICAL SUPPLY — 34 items
BENZOIN TINCTURE PRP APPL 2/3 (GAUZE/BANDAGES/DRESSINGS) ×2 IMPLANT
BNDG COHESIVE 4X5 TAN STRL (GAUZE/BANDAGES/DRESSINGS) ×2 IMPLANT
BNDG ESMARK 4X12 TAN STRL LF (GAUZE/BANDAGES/DRESSINGS) ×2 IMPLANT
BNDG GAUZE 4.5X4.1 6PLY STRL (MISCELLANEOUS) ×2 IMPLANT
BNDG STRETCH 4X75 STRL LF (GAUZE/BANDAGES/DRESSINGS) ×2 IMPLANT
CANISTER SUCT 1200ML W/VALVE (MISCELLANEOUS) ×2 IMPLANT
CUFF TOURN SGL QUICK 18X4 (TOURNIQUET CUFF) ×2 IMPLANT
DURAPREP 26ML APPLICATOR (WOUND CARE) ×2 IMPLANT
ELECT REM PT RETURN 9FT ADLT (ELECTROSURGICAL) ×2
ELECTRODE REM PT RTRN 9FT ADLT (ELECTROSURGICAL) ×1 IMPLANT
GAUZE SPONGE 4X4 12PLY STRL (GAUZE/BANDAGES/DRESSINGS) ×2 IMPLANT
GAUZE XEROFORM 1X8 LF (GAUZE/BANDAGES/DRESSINGS) ×2 IMPLANT
GLOVE BIO SURGEON STRL SZ7.5 (GLOVE) ×4 IMPLANT
GLOVE INDICATOR 8.0 STRL GRN (GLOVE) ×4 IMPLANT
GOWN STRL REUS W/ TWL LRG LVL3 (GOWN DISPOSABLE) ×2 IMPLANT
GOWN STRL REUS W/TWL LRG LVL3 (GOWN DISPOSABLE) ×2
KIT TURNOVER KIT A (KITS) ×2 IMPLANT
NS IRRIG 500ML POUR BTL (IV SOLUTION) ×2 IMPLANT
PACK EXTREMITY ARMC (MISCELLANEOUS) ×2 IMPLANT
PENCIL SMOKE EVACUATOR (MISCELLANEOUS) ×2 IMPLANT
STOCKINETTE IMPERVIOUS LG (DRAPES) ×2 IMPLANT
STRIP CLOSURE SKIN 1/4X4 (GAUZE/BANDAGES/DRESSINGS) ×2 IMPLANT
SUT ETHILON 4-0 (SUTURE) ×3
SUT ETHILON 4-0 FS2 18XMFL BLK (SUTURE) ×3
SUT MNCRL 5-0+ PC-1 (SUTURE) IMPLANT
SUT MONOCRYL 5-0 (SUTURE)
SUT VIC AB 2-0 SH 27 (SUTURE)
SUT VIC AB 2-0 SH 27XBRD (SUTURE) IMPLANT
SUT VIC AB 3-0 SH 27 (SUTURE)
SUT VIC AB 3-0 SH 27X BRD (SUTURE) IMPLANT
SUT VIC AB 4-0 FS2 27 (SUTURE) IMPLANT
SUT VIC AB 4-0 SH 27 (SUTURE) ×2
SUT VIC AB 4-0 SH 27XANBCTRL (SUTURE) ×2 IMPLANT
SUTURE ETHLN 4-0 FS2 18XMF BLK (SUTURE) ×3 IMPLANT

## 2019-03-01 NOTE — Transfer of Care (Signed)
Immediate Anesthesia Transfer of Care Note  Patient: Taylor George  Procedure(s) Performed: EXCISION;TUMOR FOOT DEEP X 4 LEFT - SEPARATE SITES (Left Foot)  Patient Location: PACU  Anesthesia Type: General LMA  Level of Consciousness: awake, alert  and patient cooperative  Airway and Oxygen Therapy: Patient Spontanous Breathing and Patient connected to supplemental oxygen  Post-op Assessment: Post-op Vital signs reviewed, Patient's Cardiovascular Status Stable, Respiratory Function Stable, Patent Airway and No signs of Nausea or vomiting  Post-op Vital Signs: Reviewed and stable  Complications: No apparent anesthesia complications

## 2019-03-01 NOTE — Anesthesia Procedure Notes (Signed)
Procedure Name: LMA Insertion Date/Time: 03/01/2019 12:19 PM Performed by: Mayme Genta, CRNA Pre-anesthesia Checklist: Patient identified, Emergency Drugs available, Suction available, Timeout performed and Patient being monitored Patient Re-evaluated:Patient Re-evaluated prior to induction Oxygen Delivery Method: Circle system utilized Preoxygenation: Pre-oxygenation with 100% oxygen Induction Type: IV induction LMA: LMA inserted LMA Size: 4.0 Number of attempts: 1 Placement Confirmation: positive ETCO2 and breath sounds checked- equal and bilateral Tube secured with: Tape

## 2019-03-01 NOTE — Anesthesia Postprocedure Evaluation (Signed)
Anesthesia Post Note  Patient: Taylor George  Procedure(s) Performed: EXCISION;TUMOR FOOT DEEP X 4 LEFT - SEPARATE SITES (Left Foot)     Patient location during evaluation: PACU Anesthesia Type: General Level of consciousness: awake and alert and oriented Pain management: satisfactory to patient Vital Signs Assessment: post-procedure vital signs reviewed and stable Respiratory status: spontaneous breathing, nonlabored ventilation and respiratory function stable Cardiovascular status: blood pressure returned to baseline and stable Postop Assessment: Adequate PO intake and No signs of nausea or vomiting Anesthetic complications: no    Raliegh Ip

## 2019-03-01 NOTE — Anesthesia Preprocedure Evaluation (Signed)
Anesthesia Evaluation  Patient identified by MRN, date of birth, ID band Patient awake    Reviewed: Allergy & Precautions, H&P , NPO status , Patient's Chart, lab work & pertinent test results  Airway Mallampati: II  TM Distance: >3 FB Neck ROM: full    Dental  (+) Edentulous Lower, Edentulous Upper   Pulmonary asthma , Current Smoker and Patient abstained from smoking.,    Pulmonary exam normal breath sounds clear to auscultation       Cardiovascular hypertension, Normal cardiovascular exam Rhythm:regular Rate:Normal     Neuro/Psych    GI/Hepatic GERD  ,  Endo/Other  diabetesMorbid obesity  Renal/GU      Musculoskeletal   Abdominal   Peds  Hematology   Anesthesia Other Findings   Reproductive/Obstetrics                             Anesthesia Physical Anesthesia Plan  ASA: III  Anesthesia Plan: General LMA   Post-op Pain Management:    Induction:   PONV Risk Score and Plan: 3 and Ondansetron, Dexamethasone, Midazolam and Treatment may vary due to age or medical condition  Airway Management Planned:   Additional Equipment:   Intra-op Plan:   Post-operative Plan:   Informed Consent: I have reviewed the patients History and Physical, chart, labs and discussed the procedure including the risks, benefits and alternatives for the proposed anesthesia with the patient or authorized representative who has indicated his/her understanding and acceptance.       Plan Discussed with: CRNA  Anesthesia Plan Comments:         Anesthesia Quick Evaluation

## 2019-03-01 NOTE — H&P (Signed)
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  The various methods of treatment have been discussed with the patient.  No guarantees were given.  After consideration of risks, benefits and other options for treatment, the patient has consented to surgery.  I have reviewed the patients' chart and labs.     A history and physical examination was performed in my office.  The patient was reexamined.  There have been no changes to this history and physical examination.  [name] A

## 2019-03-01 NOTE — Op Note (Signed)
Operative note   Surgeon:Yadriel Kerrigan Lawyer: None    Preop diagnosis: Soft tissue mass x4 plantar left foot    Postop diagnosis: Same    Procedure: Excision of soft tissue masses x4 plantar left foot    EBL: 10 mL    Anesthesia:local and general.  Local consisted of a total of 20 cc of 0.5% bupivacaine with epinephrine infiltrated along all incision sites    Hemostasis: Epinephrine 1-200,000 infiltrated along the incision sites    Specimen: Soft tissue masses x4 plantar left foot.    Complications: None    Operative indications:Taylor George is an 73 y.o. that presents today for surgical intervention.  The risks/benefits/alternatives/complications have been discussed and consent has been given.    Procedure:  Patient was brought into the OR and placed on the operating table in thesupine position. After anesthesia was obtained theleft lower extremity was prepped and draped in usual sterile fashion.  Attention was directed to the plantar aspect of the left great toe at the level of the proximal phalanx where longitudinal incision performed over the first soft tissue mass.  Sharp and blunt dissection carried down to the subcutaneous tissue to the deeper layer and the long flexor tendon.  Soft tissue mass was noted approximately a centimeter in diameter.  This was then excised and sent for pathology.  This was labeled as left great toe soft tissue mass.  The wound was flushed with copious amounts of irrigation.  Subcutaneous tissue was closed with a 4-0 Vicryl and the skin with a 4-0 nylon.  The second soft tissue mass was on the plantar lateral arch of the left foot.  This was much larger.  Approximately a 5 cm incision was made curvilinear in nature overlying the mass.  Sharp and blunt dissection carried down through the subcutaneous tissue.  A multilobulated soft tissue mass was noted superficial to the fascia emanating from the plantar fascia.  Wide excision was performed  medial and lateral.  This was sent for pathological examination.  The size of this wound was approximately 2-1/2 x 1-1/2 cm.  The wound was flushed with copious amounts of irrigation.  Closure was then performed with 4-0 Vicryl to the subcutaneous tissue and a 4-0 nylon for skin.  The third soft tissue mass was on the medial band of the plantar fascia in the mid arch.  This was much smaller.  Approximately a 2-1/2 to 3 cm incision was performed.  Sharp and blunt dissection carried down to the plantar fascia.  The soft tissue mass was consistent with a plantar fibroma directly from the medial band of all possible.  Wide excision was performed of this mass.  The wound was then flushed with copious amounts of irrigation.  This was labeled plantar medial arch soft tissue mass.  Closure was performed with a 4-0 Vicryl for the subcutaneous tissue and a 4-0 nylon for skin.  The fourth and final mass was along the medial heel.  This incision was approximately 2 cm in length.  Sharp and blunt dissection carried down to the subcutaneous tissue.  There was a well encapsulated mass that extended deep into the plantar heel deep to the plantar fascial region.  This was then excised in toto.  The wound was flushed with copious amounts of irrigation.  Closure was performed a 4-0 Vicryl for the subcutaneous tissue and a 4-0 nylon for skin.  A bulky sterile dressing was applied to the left foot.    Patient  tolerated the procedure and anesthesia well.  Was transported from the OR to the PACU with all vital signs stable and vascular status intact. To be discharged per routine protocol.  Will follow up in approximately 1 week in the outpatient clinic.  Prescription for Percocet was sent to her pharmacy.`

## 2019-03-02 ENCOUNTER — Encounter: Payer: Self-pay | Admitting: Podiatry

## 2019-03-06 LAB — SURGICAL PATHOLOGY

## 2019-08-10 ENCOUNTER — Ambulatory Visit (INDEPENDENT_AMBULATORY_CARE_PROVIDER_SITE_OTHER): Payer: Medicare HMO

## 2019-08-10 ENCOUNTER — Ambulatory Visit: Payer: Medicare HMO | Admitting: Podiatry

## 2019-08-10 ENCOUNTER — Other Ambulatory Visit: Payer: Self-pay

## 2019-08-10 DIAGNOSIS — W458XXA Other foreign body or object entering through skin, initial encounter: Secondary | ICD-10-CM | POA: Diagnosis not present

## 2019-08-10 DIAGNOSIS — M79671 Pain in right foot: Secondary | ICD-10-CM | POA: Diagnosis not present

## 2019-08-10 DIAGNOSIS — M7989 Other specified soft tissue disorders: Secondary | ICD-10-CM

## 2019-08-10 DIAGNOSIS — M722 Plantar fascial fibromatosis: Secondary | ICD-10-CM

## 2019-08-11 ENCOUNTER — Telehealth: Payer: Self-pay

## 2019-08-11 DIAGNOSIS — M7989 Other specified soft tissue disorders: Secondary | ICD-10-CM

## 2019-08-11 LAB — BUN+CREAT
BUN/Creatinine Ratio: 12 (ref 12–28)
BUN: 9 mg/dL (ref 8–27)
Creatinine, Ser: 0.77 mg/dL (ref 0.57–1.00)
GFR calc Af Amer: 89 mL/min/{1.73_m2} (ref >59)
GFR calc non Af Amer: 77 mL/min/{1.73_m2} (ref >59)

## 2019-08-11 NOTE — Progress Notes (Signed)
Subjective:  Patient ID: Taylor George, female    DOB: [birth year],  MRN: [id number]  Chief Complaint  Patient presents with  . Foot Pain    pt is here for pain of both feet, pt states that his foot is painful to the touch, pt also states that she has a possible porokeratosis of the right     74 y.o. female presents with the above complaint.  Patient presents with bilateral plantar forefoot cyst.  Patient states that this has been going for quite some time now.  Patient was seen by previous physician that tried to remove the cyst but it came right back in a cause more pain.  Patient has tried Epson salt but has not helped much.  She states is painful to walk on.  The left side is much worse on the right side.  The right side appears to be starting back up again.  She has not tried anything else.  She has not had extensive surgery to remove the soft tissue masses.  She denies any other acute complaints. Patient has history of previous foot surgery that she underwent on the right side.  Review of Systems: Negative except as noted in the HPI. Denies N/V/F/Ch.  Past Medical History:  Diagnosis Date  . Arthritis    osteoporosis  . Asthma   . Cataract 01/2019   right eye  . Diabetes mellitus without complication ([place])   . GERD (gastroesophageal reflux disease)    ulcers  . History of hiatal hernia   . Hypercholesteremia   . Hypertension   . Pneumonia    bronchitis also, both in the past    Current Outpatient Medications:  .  albuterol (PROVENTIL) (2.5 MG/3ML) 0.083% nebulizer solution, 2.5 MG(3ML) VIA NEBULIZER EVERY 4 HRS IF NEEDED SHORTNESS OF BREATH, Disp: , Rfl:  .  budesonide-formoterol (SYMBICORT) 160-4.5 MCG/ACT inhaler, Inhale 2 puffs into the lungs 2 (two) times daily., Disp: , Rfl:  .  dexlansoprazole (DEXILANT) 60 MG capsule, Take 60 mg by mouth daily as needed., Disp: , Rfl:  .  diclofenac sodium (VOLTAREN) 1 % GEL, Apply topically 4 (four) times daily., Disp: , Rfl:  .   etanercept (ENBREL) 50 MG/ML injection, Inject 50 mg into the skin once a week., Disp: , Rfl:  .  fesoterodine (TOVIAZ) 8 MG TB24 tablet, Take 8 mg by mouth daily. At bedtime, Disp: , Rfl:  .  fluticasone (FLONASE) 50 MCG/ACT nasal spray, Place 2 sprays into both nostrils daily as needed for allergies or rhinitis., Disp: , Rfl:  .  gabapentin (NEURONTIN) 600 MG tablet, Take 600 mg by mouth 2 (two) times daily. Lunch and bedtime, Disp: , Rfl:  .  ketorolac (ACULAR) 0.4 % SOLN, PLEASE SEE ATTACHED FOR DETAILED DIRECTIONS, Disp: , Rfl:  .  leflunomide (ARAVA) 20 MG tablet, , Disp: , Rfl:  .  levocetirizine (XYZAL) 5 MG tablet, Take 5 mg by mouth daily after lunch., Disp: , Rfl:  .  LORazepam (ATIVAN) 0.5 MG tablet, Take 0.5 mg by mouth daily. In the morning, Disp: , Rfl:  .  meloxicam (MOBIC) 7.5 MG tablet, Take 7.5 mg by mouth daily. In the morning, Disp: , Rfl:  .  metFORMIN (GLUCOPHAGE) 500 MG tablet, Take by mouth daily after lunch., Disp: , Rfl:  .  metoprolol succinate (TOPROL-XL) 25 MG 24 hr tablet, Take 25 mg by mouth daily. At lunch, Disp: , Rfl:  .  moxifloxacin (VIGAMOX) 0.5 % ophthalmic solution, Apply to eye., Disp: ,  Rfl:  .  oxyCODONE-acetaminophen (PERCOCET) 5-325 MG tablet, Take 1-2 tablets by mouth every 6 (six) hours as needed for severe pain., Disp: 30 tablet, Rfl: 0 .  pravastatin (PRAVACHOL) 20 MG tablet, Take 20 mg by mouth daily. With dinner, Disp: , Rfl:  .  prednisoLONE acetate (PRED FORTE) 1 % ophthalmic suspension, , Disp: , Rfl:  .  tetrahydrozoline-zinc (VISINE-AC) 0.05-0.25 % ophthalmic solution, 2 drops 2 (two) times daily., Disp: , Rfl:  .  traMADol (ULTRAM) 50 MG tablet, Take 50 mg by mouth daily as needed., Disp: , Rfl:   Social History   Tobacco Use  Smoking Status Current Every Day Smoker  . Types: Cigarettes  Smokeless Tobacco Never Used    Allergies  Allergen Reactions  . Ciprofloxacin Hives  . Penicillins Anaphylaxis  . Bee Venom Swelling    Bee  stings cause her to swell up  . Red Maple (Acer Rubrum) Allergy Skin Test Other (See Comments)    Red eyes, swollen  . Azithromycin Rash  . Keflex [Cephalexin] Rash   Objective:  There were no vitals filed for this visit. There is no height or weight on file to calculate BMI. Constitutional Well developed. Well nourished.  Vascular Dorsalis pedis pulses palpable bilaterally. Posterior tibial pulses palpable bilaterally. Capillary refill normal to all digits.  No cyanosis or clubbing noted. Pedal hair growth normal.  Neurologic Normal speech. Oriented to person, place, and time. Epicritic sensation to light touch grossly present bilaterally.  Dermatologic Nails well groomed and normal in appearance. No open wounds. No skin lesions.  Orthopedic:  Palpable soft tissue mass indurated semimobile on the plantar aspect of left and right foot.  On the left side the mass appears to be enlarged.  Negative transilluminates.  On the right side it is much smaller and more well contained.   Radiographs: 3 views of skeletally mature adult bilateral foot:   3 views of skeletally mature adult bilateral foot.  There is hardware noted to the right side.  Patient had a subtalar joint fusion as well as hammertoe correction.  The hardware is intact no signs of lucency.  This fusion of the subtalar joint appears to be solid and well consolidated.  There is increasing soft tissue density and volume to the plantar aspect of the forefoot.  Likely consistent with mass.  The left side is worse than the right side.  Arthritic changes noted at the midfoot.  Severe pes planus deformity noted bilaterally Assessment:   1. Other foreign body or object entering through skin, initial encounter   2. Plantar fasciitis of left foot   3. Mass of soft tissue of foot    Plan:  Patient was evaluated and treated and all questions answered.  Bilateral plantar soft tissue mass left greater than right. -I explained to the  patient the etiology of soft tissue mass and various treatment options were extensively discussed.  Patient had previous excision of the soft tissue mass partially to the left side which did not resolve it completely.  This was done by an outside physician.  She states that the mass is very painful.  She would like to have it surgically excised out.  Given the location of the mass and the size of the mass I believe patient will benefit from the MRI for evaluation of proper depth of the mass. -Should be scheduled to obtain an MRI of the left foot as well as the right foot.  However if unable to obtain both we  will plan on getting the left side first. -I discussed with the patient that this could also be cancerous tumor given the size and the location of it.  However we will discuss it further after the MRI. -I am going to hold off on any kind of aspiration and injection for now to properly evaluate the soft tissue mass.  Also given that patient has had this done to the left side prior and did not help.  No follow-ups on file.

## 2019-08-11 NOTE — Telephone Encounter (Signed)
MRI has been approved from 08/11/2019 to 09/10/2019 Auth # [id number] per [employer]  Patient notified via voice mail of approval and will call to schedule appt to her convenience

## 2019-08-15 ENCOUNTER — Encounter: Payer: Self-pay | Admitting: Podiatry

## 2019-08-15 ENCOUNTER — Telehealth: Payer: Self-pay

## 2019-08-27 ENCOUNTER — Other Ambulatory Visit: Payer: Self-pay

## 2019-08-27 ENCOUNTER — Ambulatory Visit
Admission: RE | Admit: 2019-08-27 | Discharge: 2019-08-27 | Disposition: A | Discharge status: Home / Self Care | Payer: Medicare HMO | Source: Ambulatory Visit | Attending: Podiatry | Admitting: Podiatry

## 2019-08-27 DIAGNOSIS — M7989 Other specified soft tissue disorders: Secondary | ICD-10-CM | POA: Insufficient documentation

## 2019-08-27 IMAGING — MR MR FOOT*L* WO/W CM
9 series · 40 of 40 positions shown · IV contrast (gadavist)
Comparison: Left foot x-rays dated [DATE]. MRI left foot
dated [DATE].

CLINICAL DATA: Plantar foot pain with soft tissue mass. Multiple
rheumatoid nodules excised from the plantar foot in [DATE].

EXAM:
MRI OF THE LEFT FOREFOOT WITHOUT AND WITH CONTRAST
TECHNIQUE: Multiplanar, multisequence MR imaging of the left forefoot was
performed both before and after administration of intravenous
contrast.
CONTRAST:  10mL GADAVIST GADOBUTROL 1 MMOL/ML IV SOLN

[Series 5: T2 · coronal · left · 3.0mm · 0.47mm/px · 6 of 52 slices shown (1 of 2)]
[im 1/52]
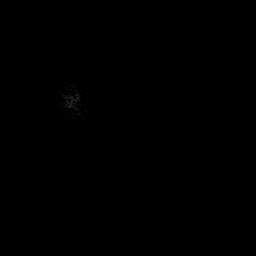
[im 11/52]
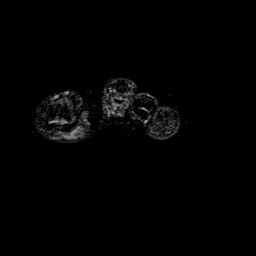
[im 21/52]
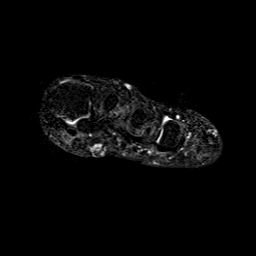
[im 31/52]
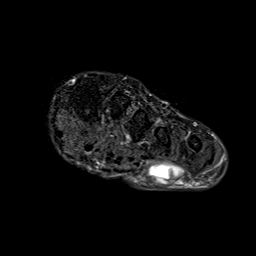
[im 41/52]
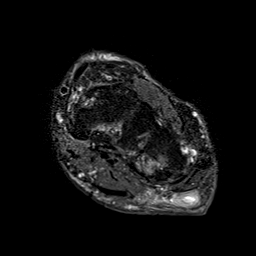
[im 52/52]
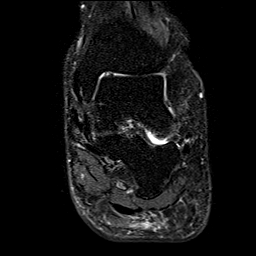

[Series 9: T1 · axial · left · 3.0mm · 0.70mm/px · z∈[-75,+12]mm · 3 of 24 slices shown]
[im 1/24]
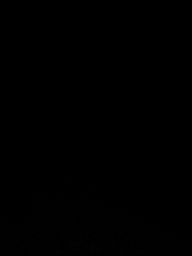
[im 12/24]
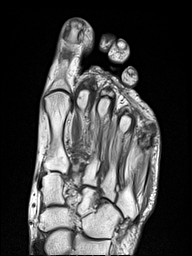
[im 24/24]
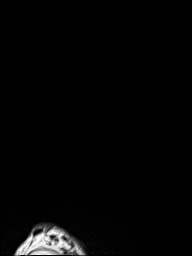

[Series 11: T2 · axial · left · 3.0mm · 0.70mm/px · z∈[-72,+12]mm · 3 of 24 slices shown (2 of 2)]
[im 1/24]
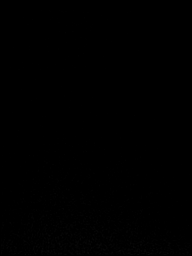
[im 12/24]
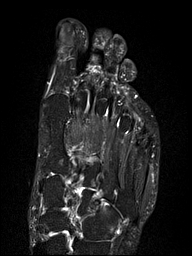
[im 24/24]
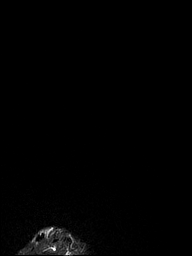

[Series 12: STIR · sagittal · left · 3.0mm · 0.62mm/px · 4 of 31 slices shown]
[im 1/31]
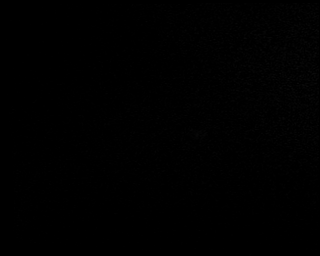
[im 11/31]
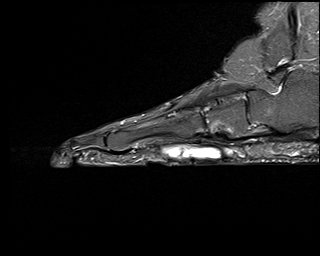
[im 21/31]
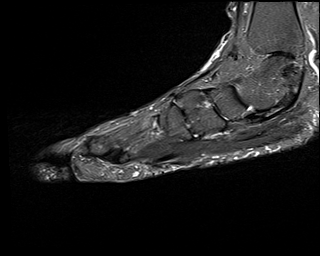
[im 31/31]
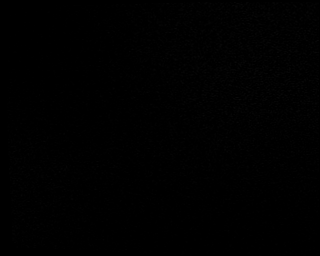

[Series 13: ax t1_in · coronal · left · 3.0mm · 0.47mm/px · 6 of 52 slices shown]
[im 1/52]
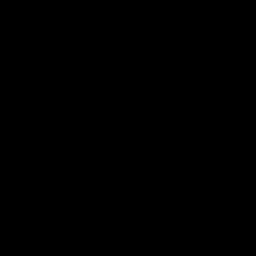
[im 11/52]
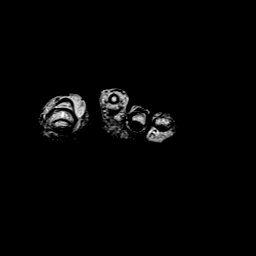
[im 21/52]
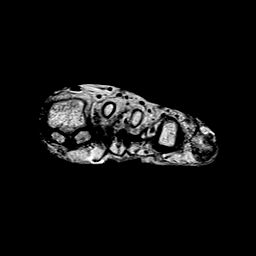
[im 31/52]
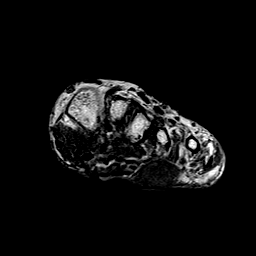
[im 41/52]
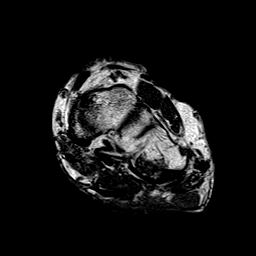
[im 52/52]
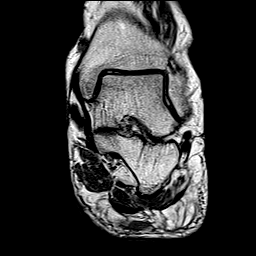

[Series 15: ax t1_w · coronal · left · 3.0mm · 0.47mm/px · 6 of 52 slices shown]
[im 1/52]
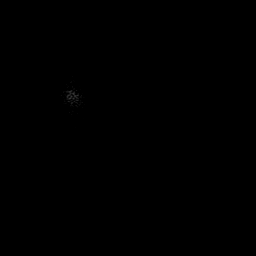
[im 11/52]
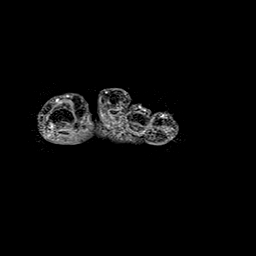
[im 21/52]
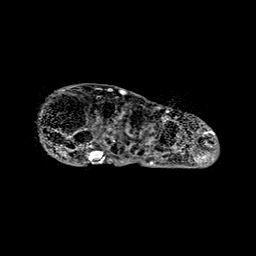
[im 31/52]
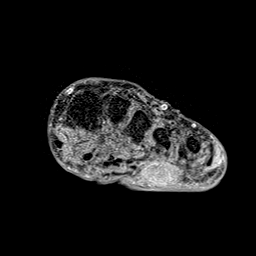
[im 41/52]
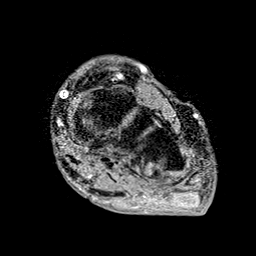
[im 52/52]
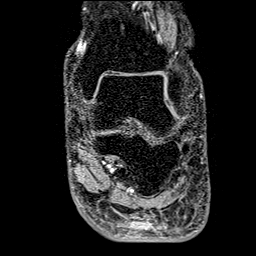

[Series 17: T1 fat-sat · coronal · left · 3.0mm · 0.47mm/px · 6 of 52 slices shown (1 of 3)]
[im 1/52]
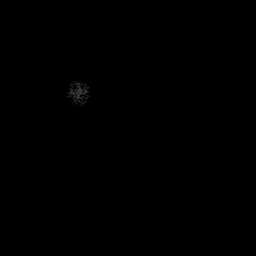
[im 11/52]
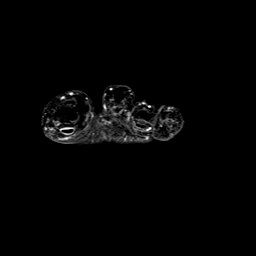
[im 21/52]
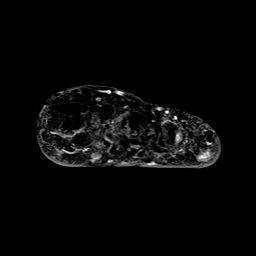
[im 31/52]
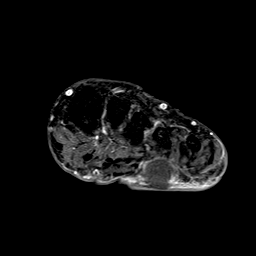
[im 41/52]
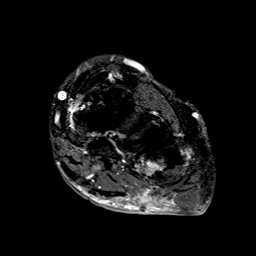
[im 52/52]
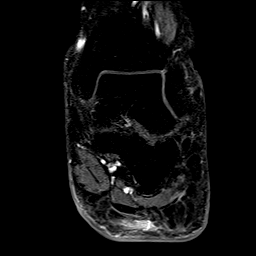

[Series 18: T1 fat-sat · sagittal · left · 3.0mm · 0.62mm/px · 3 of 29 slices shown (2 of 3)]
[im 1/29]
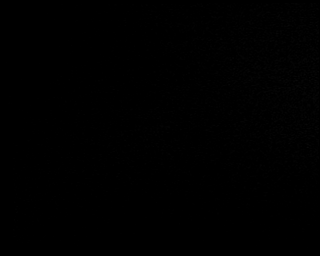
[im 15/29]
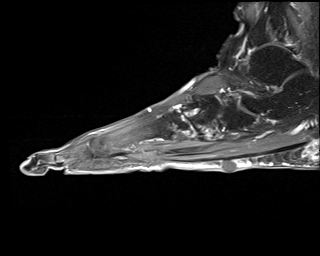
[im 29/29]
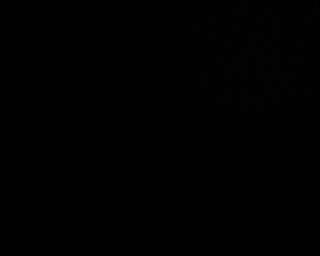

[Series 19: T1 fat-sat · axial · left · 3.0mm · 0.70mm/px · z∈[-72,+12]mm · 3 of 23 slices shown (3 of 3)]
[im 1/23]
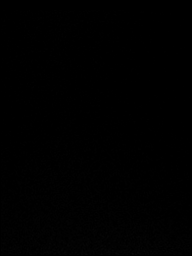
[im 12/23]
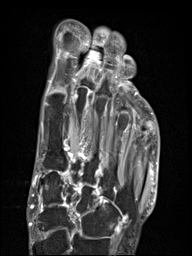
[im 23/23]
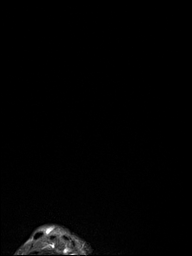

[40 of 40 positions shown; findings below may reference images not displayed]

FINDINGS: Bones/Joint/Cartilage

Similar appearing erosive changes involving the naviculocuneiform
and third through fifth TMT, with improved reactive marrow edema.
Improved synovitis of the second MTP joint. Unchanged chronic gross
changes of the fifth MTP joint. No fracture or dislocation. No joint
effusion.

Ligaments

Collateral ligaments are intact.  Lisfranc ligament is intact.

Muscles and Tendons
Flexor, peroneal and extensor compartment tendons are intact. Mild
flexor hallucis longus tendon sheath enhancement. Unchanged focal
tendinosis and tenosynovitis of the anterior tibialis tendon.

Soft tissue
Interval resection of the previously described plantar subcutaneous
masses. There is a 5.3 x 3.6 cm fluid collection at the site of the
mass plantar to the Lisfranc joint with no significant rim
enhancement, but mild surrounding soft tissue enhancement. Similar
appearing 2.2 x 2.1 cm fluid collection with some rim enhancement in
the plantar aspect of the great toe.

There is a new round 1.0 x 0.9 cm T2 hypointense, slightly T1
hyperintense mass without significant enhancement in the midline
plantar subcutaneous fat at the level of the midfoot (series 13,
image 40). Similar appearing new 1.0 x 0.7 cm mass in the midline
plantar subcutaneous fat at the level of the fourth metatarsal base
(series 13, image 33). Unchanged smaller lesion at the plantar
aspect of the second distal phalanx.
IMPRESSION: 1. Two new 1.0 cm subcutaneous nodules in the plantar midfoot,
consistent with rheumatoid nodules.
2. Interval resection of plantar rheumatoid nodules of the great toe
and lateral midfoot with postoperative fluid collections that are
likely seromas. Correlate for any signs or symptoms of infection.
3. Chronic erosive arthropathy of the midfoot and second and fifth
MTP joints, consistent with history of rheumatoid arthritis.
Reactive marrow edema has improved since the prior study.
4. New mild flexor hallucis longus tenosynovitis. Unchanged focal
anterior tibialis tendinosis and tenosynovitis.

## 2019-08-27 MED ORDER — GADOBUTROL 1 MMOL/ML IV SOLN
10.0000 mL | Freq: Once | INTRAVENOUS | Status: AC | PRN
  Administered 2019-08-27: 10 mL via INTRAVENOUS

## 2019-09-04 ENCOUNTER — Telehealth: Payer: Self-pay | Admitting: *Deleted

## 2019-09-04 NOTE — Telephone Encounter (Signed)
I am returning your call.  We do have you MRI results but Dr. Posey Pronto has not seen it yet.  He is out on paternity leave.  We have you scheduled to come in to see him on 09/14/2019.  "Oh, that is sweet.  Please tell him congratulations when he comes in from me."  I sure will.  "Thanks for calling me back."

## 2019-09-04 NOTE — Telephone Encounter (Signed)
"  I had an MRI done.  I was wondering if the doctor got the results back and when I can come in to see him again.  Please call me back."

## 2019-09-14 ENCOUNTER — Ambulatory Visit (INDEPENDENT_AMBULATORY_CARE_PROVIDER_SITE_OTHER): Payer: Medicare HMO | Admitting: Podiatry

## 2019-09-14 ENCOUNTER — Other Ambulatory Visit: Payer: Self-pay

## 2019-09-14 DIAGNOSIS — M0630 Rheumatoid nodule, unspecified site: Secondary | ICD-10-CM | POA: Diagnosis not present

## 2019-09-14 DIAGNOSIS — Z01818 Encounter for other preprocedural examination: Secondary | ICD-10-CM

## 2019-09-14 DIAGNOSIS — M7989 Other specified soft tissue disorders: Secondary | ICD-10-CM

## 2019-09-14 NOTE — Patient Instructions (Signed)
Pre-Operative Instructions  Congratulations, you have decided to take an important step towards improving your quality of life.  You can be assured that the doctors and staff at [facility] will be with you every step of the way.  Here are some important things you should know:  1. Plan to be at the surgery center/hospital at least 1 (one) hour prior to your scheduled time, unless otherwise directed by the surgical center/hospital staff.  You must have a responsible adult accompany you, remain during the surgery and drive you home.  Make sure you have directions to the surgical center/hospital to ensure you arrive on time. 2. If you are having surgery at [facility] or [facility], you will need a copy of your medical history and physical form from your family physician within one month prior to the date of surgery. We will give you a form for your primary physician to complete.  3. We make every effort to accommodate the date you request for surgery.  However, there are times where surgery dates or times have to be moved.  We will contact you as soon as possible if a change in schedule is required.   4. No aspirin/ibuprofen for one week before surgery.  If you are on aspirin, any non-steroidal anti-inflammatory medications (Mobic, Aleve, Ibuprofen) should not be taken seven (7) days prior to your surgery.  You make take Tylenol for pain prior to surgery.  5. Medications - If you are taking daily heart and blood pressure medications, seizure, reflux, allergy, asthma, anxiety, pain or diabetes medications, make sure you notify the surgery center/hospital before the day of surgery so they can tell you which medications you should take or avoid the day of surgery. 6. No food or drink after midnight the night before surgery unless directed otherwise by surgical center/hospital staff. 7. No alcoholic beverages 24-hours prior to surgery.  No smoking 24-hours prior or 24-hours after  surgery. 8. Wear loose pants or shorts. They should be loose enough to fit over bandages, boots, and casts. 9. Don't wear slip-on shoes. Sneakers are preferred. 10. Bring your boot with you to the surgery center/hospital.  Also bring crutches or a walker if your physician has prescribed it for you.  If you do not have this equipment, it will be provided for you after surgery. 11. If you have not been contacted by the surgery center/hospital by the day before your surgery, call to confirm the date and time of your surgery. 12. Leave-time from work may vary depending on the type of surgery you have.  Appropriate arrangements should be made prior to surgery with your employer. 13. Prescriptions will be provided immediately following surgery by your doctor.  Fill these as soon as possible after surgery and take the medication as directed. Pain medications will not be refilled on weekends and must be approved by the doctor. 14. Remove nail polish on the operative foot and avoid getting pedicures prior to surgery. 15. Wash the night before surgery.  The night before surgery wash the foot and leg well with water and the antibacterial soap provided. Be sure to pay special attention to beneath the toenails and in between the toes.  Wash for at least three (3) minutes. Rinse thoroughly with water and dry well with a towel.  Perform this wash unless told not to do so by your physician.  Enclosed: 1 Ice pack (please put in freezer the night before surgery)   1 Hibiclens skin cleaner     Pre-op instructions  If you have any questions regarding the instructions, please do not hesitate to call our office.  [place]: [address], [place], [place] [address] -- [phone]  [place]: [address] [place], [place] [address] -- [phone]  [place]: [address], [place], [place] [address] -- [phone]   Website: [email or web address] 

## 2019-09-20 ENCOUNTER — Telehealth: Payer: Self-pay

## 2019-09-20 ENCOUNTER — Encounter: Payer: Self-pay | Admitting: Podiatry

## 2019-09-20 NOTE — Progress Notes (Signed)
Subjective:  Patient ID: Taylor George, female    DOB: 19-Mar-1946,  MRN: [id number]  Chief Complaint  Patient presents with  . Foot Pain    pt is here for foot pain    74 y.o. female presents with the above complaint.  Patient presents with a follow-up of bilateral plantar forefoot cyst was left worse than right.  Patient had done an MRI which showed rheumatoid nodule to the left plantar aspect of the foot as well as the great toe site.  Patient is well-known to rheumatologist who has been treating the patient for rheumatoid arthritis and has been controlled on medication.  She denies any other acute complaints.  She would like to have this completely excised out.  She has had history of multiple foot surgery on the left on the right side.  The right side is seems to be doing much better now.  She denies any other acute complaints.  She would like to discuss surgical options at this time.  Review of Systems: Negative except as noted in the HPI. Denies N/V/F/Ch.  Past Medical History:  Diagnosis Date  . Arthritis    osteoporosis  . Asthma   . Cataract 01/2019   right eye  . Diabetes mellitus without complication ([place])   . GERD (gastroesophageal reflux disease)    ulcers  . History of hiatal hernia   . Hypercholesteremia   . Hypertension   . Pneumonia    bronchitis also, both in the past    Current Outpatient Medications:  .  albuterol (PROVENTIL) (2.5 MG/3ML) 0.083% nebulizer solution, 2.5 MG(3ML) VIA NEBULIZER EVERY 4 HRS IF NEEDED SHORTNESS OF BREATH, Disp: , Rfl:  .  budesonide-formoterol (SYMBICORT) 160-4.5 MCG/ACT inhaler, Inhale 2 puffs into the lungs 2 (two) times daily., Disp: , Rfl:  .  dexlansoprazole (DEXILANT) 60 MG capsule, Take 60 mg by mouth daily as needed., Disp: , Rfl:  .  diclofenac sodium (VOLTAREN) 1 % GEL, Apply topically 4 (four) times daily., Disp: , Rfl:  .  etanercept (ENBREL) 50 MG/ML injection, Inject 50 mg into the skin once a week., Disp: , Rfl:  .   fesoterodine (TOVIAZ) 8 MG TB24 tablet, Take 8 mg by mouth daily. At bedtime, Disp: , Rfl:  .  fluticasone (FLONASE) 50 MCG/ACT nasal spray, Place 2 sprays into both nostrils daily as needed for allergies or rhinitis., Disp: , Rfl:  .  gabapentin (NEURONTIN) 600 MG tablet, Take 600 mg by mouth 2 (two) times daily. Lunch and bedtime, Disp: , Rfl:  .  ketorolac (ACULAR) 0.4 % SOLN, PLEASE SEE ATTACHED FOR DETAILED DIRECTIONS, Disp: , Rfl:  .  leflunomide (ARAVA) 20 MG tablet, , Disp: , Rfl:  .  levocetirizine (XYZAL) 5 MG tablet, Take 5 mg by mouth daily after lunch., Disp: , Rfl:  .  LORazepam (ATIVAN) 0.5 MG tablet, Take 0.5 mg by mouth daily. In the morning, Disp: , Rfl:  .  meloxicam (MOBIC) 7.5 MG tablet, Take 7.5 mg by mouth daily. In the morning, Disp: , Rfl:  .  metFORMIN (GLUCOPHAGE) 500 MG tablet, Take by mouth daily after lunch., Disp: , Rfl:  .  metoprolol succinate (TOPROL-XL) 25 MG 24 hr tablet, Take 25 mg by mouth daily. At lunch, Disp: , Rfl:  .  moxifloxacin (VIGAMOX) 0.5 % ophthalmic solution, Apply to eye., Disp: , Rfl:  .  oxyCODONE-acetaminophen (PERCOCET) 5-325 MG tablet, Take 1-2 tablets by mouth every 6 (six) hours as needed for severe pain., Disp: 30  tablet, Rfl: 0 .  pravastatin (PRAVACHOL) 20 MG tablet, Take 20 mg by mouth daily. With dinner, Disp: , Rfl:  .  prednisoLONE acetate (PRED FORTE) 1 % ophthalmic suspension, , Disp: , Rfl:  .  tetrahydrozoline-zinc (VISINE-AC) 0.05-0.25 % ophthalmic solution, 2 drops 2 (two) times daily., Disp: , Rfl:  .  traMADol (ULTRAM) 50 MG tablet, Take 50 mg by mouth daily as needed., Disp: , Rfl:   Social History   Tobacco Use  Smoking Status Current Every Day Smoker  . Types: Cigarettes  Smokeless Tobacco Never Used    Allergies  Allergen Reactions  . Ciprofloxacin Hives  . Penicillins Anaphylaxis  . Bee Venom Swelling    Bee stings cause her to swell up  . Red Maple (Acer Rubrum) Allergy Skin Test Other (See Comments)     Red eyes, swollen  . Azithromycin Rash  . Keflex [Cephalexin] Rash   Objective:  There were no vitals filed for this visit. There is no height or weight on file to calculate BMI. Constitutional Well developed. Well nourished.  Vascular Dorsalis pedis pulses palpable bilaterally. Posterior tibial pulses palpable bilaterally. Capillary refill normal to all digits.  No cyanosis or clubbing noted. Pedal hair growth normal.  Neurologic Normal speech. Oriented to person, place, and time. Epicritic sensation to light touch grossly present bilaterally.  Dermatologic Nails well groomed and normal in appearance. No open wounds. No skin lesions.  Orthopedic:  Palpable soft tissue mass indurated semimobile on the plantar aspect of left and right foot.  On the left side the mass appears to be enlarged.  Negative transilluminates.  On the right side it is much smaller and more well contained.   Radiographs: 3 views of skeletally mature adult bilateral foot:   3 views of skeletally mature adult bilateral foot.  There is hardware noted to the right side.  Patient had a subtalar joint fusion as well as hammertoe correction.  The hardware is intact no signs of lucency.  This fusion of the subtalar joint appears to be solid and well consolidated.  There is increasing soft tissue density and volume to the plantar aspect of the forefoot.  Likely consistent with mass.  The left side is worse than the right side.  Arthritic changes noted at the midfoot.  Severe pes planus deformity noted bilaterally Assessment:   1. Mass of soft tissue of foot   2. Rheumatoid nodules ([place])   3. Preop examination    Plan:  Patient was evaluated and treated and all questions answered.  Bilateral plantar soft tissue mass left greater than right. -I explained to the patient the etiology of soft tissue mass and various treatment options were extensively discussed.  Patient had previous excision of the soft tissue mass partially  to the left side which did not resolve it completely.  This was done by an outside physician.  She states that the mass is very painful.  She would like to have it surgically excised out.  -MRI was reviewed with the patient in extensive detail.  I believe patient will benefit from a surgical excision of the soft tissue mass.  I will have to make a plantar incision and I discussed with the patient the plantar incision tend to stay completely longer to heal and high signs of dehiscence.  Also discussed with the patient the rheumatoid nodules also have a high recurrence rate as well.  Patient states understanding like to proceed despite the recurrence rate as well as the plantar incision.  Also will plan on excising the soft tissue mass from the great toe as well to the left side.  The incision will be plantar as well.  Anticipate that healing a little bit better than the plantar lateral foot incision. -I discussed with the patient my postop protocol in extensive detail.  She will be nonweightbearing with a cam boot on.  Cam boot was dispensed. -Informed surgical risk consent was reviewed and read aloud to the patient.  I reviewed the films.  I have discussed my findings with the patient in great detail.  I have discussed all risks including but not limited to infection, stiffness, scarring, limp, disability, deformity, damage to blood vessels and nerves, numbness, poor healing, need for braces, arthritis, chronic pain, amputation, death.  All benefits and realistic expectations discussed in great detail.  I have made no promises as to the outcome.  I have provided realistic expectations.  I have offered the patient a 2nd opinion, which they have declined and assured me they preferred to proceed despite the risks -A total of 34 minutes was spent in direct patient care as well as pre and post patient encounter activities.  This includes documentation as well as reviewing patient chart for labs, imaging, past  medical, surgical, social, and family history as documented in the EMR.  I have reviewed medication allergies as documented in EMR.  I discussed the etiology of condition and treatment options from conservative to surgical care.  All risks and benefit of the treatment course was discussed in detail.  All questions were answered and return appointment was discussed.  Since the visit completed in an ambulatory/outpatient setting, the patient and/or parent/guardian has been advised to contact the providers office for worsening condition and seek medical treatment and/or call 911 if the patient deems either is necessary.  No follow-ups on file.

## 2019-09-20 NOTE — Telephone Encounter (Signed)
DOS 10/09/2019  Plantar FIBROMA LT - 28052  PER [facility] CPT 28052 DOES NOT REQUIRE PRECERT

## 2019-10-09 ENCOUNTER — Encounter: Payer: Self-pay | Admitting: Podiatry

## 2019-10-09 DIAGNOSIS — D492 Neoplasm of unspecified behavior of bone, soft tissue, and skin: Secondary | ICD-10-CM | POA: Diagnosis not present

## 2019-10-09 MED ORDER — IBUPROFEN 800 MG PO TABS: 800.0000 mg | ORAL_TABLET | Freq: Four times a day (QID) | ORAL | 1 refills | Status: DC | PRN

## 2019-10-09 MED ORDER — OXYCODONE-ACETAMINOPHEN 5-325 MG PO TABS: 1.0000 | ORAL_TABLET | Freq: Four times a day (QID) | ORAL | 0 refills | Status: AC | PRN

## 2019-10-09 NOTE — Addendum Note (Signed)
Addended by: Boneta Lucks on: 10/09/2019 07:04 AM   Modules accepted: Orders

## 2019-10-12 ENCOUNTER — Encounter: Payer: Self-pay | Admitting: Podiatry

## 2019-10-16 ENCOUNTER — Ambulatory Visit (INDEPENDENT_AMBULATORY_CARE_PROVIDER_SITE_OTHER): Payer: Medicare HMO | Admitting: Podiatry

## 2019-10-16 ENCOUNTER — Other Ambulatory Visit: Payer: Self-pay

## 2019-10-16 ENCOUNTER — Encounter: Payer: Self-pay | Admitting: Podiatry

## 2019-10-16 DIAGNOSIS — Z9889 Other specified postprocedural states: Secondary | ICD-10-CM

## 2019-10-16 DIAGNOSIS — M0630 Rheumatoid nodule, unspecified site: Secondary | ICD-10-CM

## 2019-10-16 DIAGNOSIS — M7989 Other specified soft tissue disorders: Secondary | ICD-10-CM

## 2019-10-16 NOTE — Progress Notes (Signed)
Subjective:  Patient ID: Taylor George, female    DOB: 1946/02/20,  MRN: [id number]  Chief Complaint  Patient presents with   Routine Post Op    "I think it's doing okay."     74 y.o. female returns for post-op check.  Patient states she is doing well.  She denies any other acute complaints.  She has been not compliant with nonweightbearing status however she has been ambulating as little as possible.  She denies any clinical signs of infection.  She has been keeping the bandage about dry clean and intact.  Review of Systems: Negative except as noted in the HPI. Denies N/V/F/Ch.  Past Medical History:  Diagnosis Date   Arthritis    osteoporosis   Asthma    Cataract 01/2019   right eye   Diabetes mellitus without complication (HCC)    GERD (gastroesophageal reflux disease)    ulcers   History of hiatal hernia    Hypercholesteremia    Hypertension    Pneumonia    bronchitis also, both in the past    Current Outpatient Medications:    albuterol (PROVENTIL) (2.5 MG/3ML) 0.083% nebulizer solution, 2.5 MG(3ML) VIA NEBULIZER EVERY 4 HRS IF NEEDED SHORTNESS OF BREATH, Disp: , Rfl:    budesonide-formoterol (SYMBICORT) 160-4.5 MCG/ACT inhaler, Inhale 2 puffs into the lungs 2 (two) times daily., Disp: , Rfl:    dexlansoprazole (DEXILANT) 60 MG capsule, Take 60 mg by mouth daily as needed., Disp: , Rfl:    diclofenac sodium (VOLTAREN) 1 % GEL, Apply topically 4 (four) times daily., Disp: , Rfl:    etanercept (ENBREL) 50 MG/ML injection, Inject 50 mg into the skin once a week., Disp: , Rfl:    fesoterodine (TOVIAZ) 8 MG TB24 tablet, Take 8 mg by mouth daily. At bedtime, Disp: , Rfl:    fluticasone (FLONASE) 50 MCG/ACT nasal spray, Place 2 sprays into both nostrils daily as needed for allergies or rhinitis., Disp: , Rfl:    gabapentin (NEURONTIN) 600 MG tablet, Take 600 mg by mouth 2 (two) times daily. Lunch and bedtime, Disp: , Rfl:    ibuprofen (ADVIL) 800 MG  tablet, Take 1 tablet (800 mg total) by mouth every 6 (six) hours as needed., Disp: 60 tablet, Rfl: 1   ketorolac (ACULAR) 0.4 % SOLN, PLEASE SEE ATTACHED FOR DETAILED DIRECTIONS, Disp: , Rfl:    leflunomide (ARAVA) 20 MG tablet, , Disp: , Rfl:    levocetirizine (XYZAL) 5 MG tablet, Take 5 mg by mouth daily after lunch., Disp: , Rfl:    LORazepam (ATIVAN) 0.5 MG tablet, Take 0.5 mg by mouth daily. In the morning, Disp: , Rfl:    meloxicam (MOBIC) 7.5 MG tablet, Take 7.5 mg by mouth daily. In the morning, Disp: , Rfl:    metFORMIN (GLUCOPHAGE) 500 MG tablet, Take by mouth daily after lunch., Disp: , Rfl:    metoprolol succinate (TOPROL-XL) 25 MG 24 hr tablet, Take 25 mg by mouth daily. At lunch, Disp: , Rfl:    moxifloxacin (VIGAMOX) 0.5 % ophthalmic solution, Apply to eye., Disp: , Rfl:    oxyCODONE-acetaminophen (PERCOCET) 5-325 MG tablet, Take 1-2 tablets by mouth every 6 (six) hours as needed for severe pain., Disp: 30 tablet, Rfl: 0   pravastatin (PRAVACHOL) 20 MG tablet, Take 20 mg by mouth daily. With dinner, Disp: , Rfl:    prednisoLONE acetate (PRED FORTE) 1 % ophthalmic suspension, , Disp: , Rfl:    tetrahydrozoline-zinc (VISINE-AC) 0.05-0.25 % ophthalmic solution, 2 drops  2 (two) times daily., Disp: , Rfl:    traMADol (ULTRAM) 50 MG tablet, Take 50 mg by mouth daily as needed., Disp: , Rfl:   Social History   Tobacco Use  Smoking Status Current Every Day Smoker   Types: Cigarettes  Smokeless Tobacco Never Used    Allergies  Allergen Reactions   Ciprofloxacin Hives   Penicillins Anaphylaxis   Bee Venom Swelling    Bee stings cause her to swell up   Red Maple (Acer Rubrum) Allergy Skin Test Other (See Comments)    Red eyes, swollen   Azithromycin Rash   Keflex [Cephalexin] Rash   Objective:  There were no vitals filed for this visit. There is no height or weight on file to calculate BMI. Constitutional Well developed. Well nourished.  Vascular Foot  warm and well perfused. Capillary refill normal to all digits.   Neurologic Normal speech. Oriented to person, place, and time. Epicritic sensation to light touch grossly present bilaterally.  Dermatologic Skin healing well without signs of infection. Skin edges well coapted without signs of infection.  Orthopedic: Tenderness to palpation noted about the surgical site.   Radiographs: None Assessment:   1. Mass of soft tissue of foot   2. Rheumatoid nodules (HCC)   3. Status post foot surgery    Plan:  Patient was evaluated and treated and all questions answered.  S/p foot surgery left -Progressing as expected post-operatively. -XR: None -WB Status: Nonweightbearing to the left lower extremity secondary to plantar incision -Sutures: Intact.  Mild maceration present no clinical signs of dehiscence noted. -Medications: None -Foot redressed.  No follow-ups on file.

## 2019-10-24 ENCOUNTER — Ambulatory Visit (INDEPENDENT_AMBULATORY_CARE_PROVIDER_SITE_OTHER): Payer: Medicare HMO | Admitting: Podiatry

## 2019-10-24 ENCOUNTER — Encounter: Payer: Self-pay | Admitting: Podiatry

## 2019-10-24 ENCOUNTER — Other Ambulatory Visit: Payer: Self-pay

## 2019-10-24 DIAGNOSIS — M7989 Other specified soft tissue disorders: Secondary | ICD-10-CM

## 2019-10-24 DIAGNOSIS — Z9889 Other specified postprocedural states: Secondary | ICD-10-CM

## 2019-10-24 DIAGNOSIS — M0630 Rheumatoid nodule, unspecified site: Secondary | ICD-10-CM

## 2019-10-24 NOTE — Progress Notes (Signed)
Subjective:  Patient ID: Taylor George, female    DOB: [birth year],  MRN: [id number]  Chief Complaint  Patient presents with   Routine Post Op     POV #2 DOS 10/09/19 LT FOOT EXCUSION OF SOFT TISSUE MASS/TUMOR     74 y.o. female returns for post-op check.  Patient states she is doing well.  However she has been on her foot more often than not.  Patient states the bottom skin has gapped open.  There is no swelling associated with it.  Patient has been keeping it well bandaged.  She denies any other acute complaints.  Review of Systems: Negative except as noted in the HPI. Denies N/V/F/Ch.  Past Medical History:  Diagnosis Date   Arthritis    osteoporosis   Asthma    Cataract 01/2019   right eye   Diabetes mellitus without complication (H[facility])    GERD (gastroesophageal reflux disease)    ulcers   History of hiatal hernia    Hypercholesteremia    Hypertension    Pneumonia    bronchitis also, both in the past    Current Outpatient Medications:    albuterol (PROVENTIL) (2.5 MG/3ML) 0.083% nebulizer solution, 2.5 MG(3ML) VIA NEBULIZER EVERY 4 HRS IF NEEDED SHORTNESS OF BREATH, Disp: , Rfl:    budesonide-formoterol (SYMBICORT) 160-4.5 MCG/ACT inhaler, Inhale 2 puffs into the lungs 2 (two) times daily., Disp: , Rfl:    dexlansoprazole (DEXILANT) 60 MG capsule, Take 60 mg by mouth daily as needed., Disp: , Rfl:    diclofenac sodium (VOLTAREN) 1 % GEL, Apply topically 4 (four) times daily., Disp: , Rfl:    etanercept (ENBREL) 50 MG/ML injection, Inject 50 mg into the skin once a week., Disp: , Rfl:    fesoterodine (TOVIAZ) 8 MG TB24 tablet, Take 8 mg by mouth daily. At bedtime, Disp: , Rfl:    fluticasone (FLONASE) 50 MCG/ACT nasal spray, Place 2 sprays into both nostrils daily as needed for allergies or rhinitis., Disp: , Rfl:    gabapentin (NEURONTIN) 600 MG tablet, Take 600 mg by mouth 2 (two) times daily. Lunch and bedtime, Disp: , Rfl:    ibuprofen (ADVIL) 800 MG  tablet, Take 1 tablet (800 mg total) by mouth every 6 (six) hours as needed., Disp: 60 tablet, Rfl: 1   ketorolac (ACULAR) 0.4 % SOLN, PLEASE SEE ATTACHED FOR DETAILED DIRECTIONS, Disp: , Rfl:    leflunomide (ARAVA) 20 MG tablet, , Disp: , Rfl:    levocetirizine (XYZAL) 5 MG tablet, Take 5 mg by mouth daily after lunch., Disp: , Rfl:    LORazepam (ATIVAN) 0.5 MG tablet, Take 0.5 mg by mouth daily. In the morning, Disp: , Rfl:    meloxicam (MOBIC) 7.5 MG tablet, Take 7.5 mg by mouth daily. In the morning, Disp: , Rfl:    metFORMIN (GLUCOPHAGE) 500 MG tablet, Take by mouth daily after lunch., Disp: , Rfl:    metoprolol succinate (TOPROL-XL) 25 MG 24 hr tablet, Take 25 mg by mouth daily. At lunch, Disp: , Rfl:    mirabegron ER (MYRBETRIQ) 25 MG TB24 tablet, Take by mouth., Disp: , Rfl:    moxifloxacin (VIGAMOX) 0.5 % ophthalmic solution, Apply to eye., Disp: , Rfl:    oxyCODONE-acetaminophen (PERCOCET) 5-325 MG tablet, Take 1-2 tablets by mouth every 6 (six) hours as needed for severe pain., Disp: 30 tablet, Rfl: 0   pravastatin (PRAVACHOL) 20 MG tablet, Take 20 mg by mouth daily. With dinner, Disp: , Rfl:    prednisoLONE  acetate (PRED FORTE) 1 % ophthalmic suspension, , Disp: , Rfl:    tetrahydrozoline-zinc (VISINE-AC) 0.05-0.25 % ophthalmic solution, 2 drops 2 (two) times daily., Disp: , Rfl:    traMADol (ULTRAM) 50 MG tablet, Take 50 mg by mouth daily as needed., Disp: , Rfl:   Social History   Tobacco Use  Smoking Status Current Every Day Smoker   Types: Cigarettes  Smokeless Tobacco Never Used    Allergies  Allergen Reactions   Ciprofloxacin Hives   Penicillins Anaphylaxis   Bee Venom Swelling    Bee stings cause her to swell up   Red Maple (Acer Rubrum) Allergy Skin Test Other (See Comments)    Red eyes, swollen   Azithromycin Rash   Keflex [Cephalexin] Rash   Objective:  There were no vitals filed for this visit. There is no height or weight on file to  calculate BMI. Constitutional Well developed. Well nourished.  Vascular Foot warm and well perfused. Capillary refill normal to all digits.   Neurologic Normal speech. Oriented to person, place, and time. Epicritic sensation to light touch grossly present bilaterally.  Dermatologic Skin healing well without signs of infection. Skin edges well coapted without signs of infection.  Dehiscence noted noted.  No clinical signs of infection noted.  No purulent drainage noted.  Orthopedic: Tenderness to palpation noted about the surgical site.   Radiographs: None Assessment:   1. Mass of soft tissue of foot   2. Rheumatoid nodules (HCC)   3. Status post foot surgery    Plan:  Patient was evaluated and treated and all questions answered.  S/p foot surgery left -Progressing as expected post-operatively. -XR: None -WB Status: Nonweightbearing to the left lower extremity secondary to plantar incision -Sutures: Dehiscence noted to the plantar left foot.  Patient has been ambulating on the foot and being noncompliant. -Medications: None -Foot redressed.  No follow-ups on file.

## 2019-11-07 ENCOUNTER — Other Ambulatory Visit: Payer: Self-pay

## 2019-11-07 ENCOUNTER — Encounter: Payer: Self-pay | Admitting: Podiatry

## 2019-11-07 ENCOUNTER — Ambulatory Visit (INDEPENDENT_AMBULATORY_CARE_PROVIDER_SITE_OTHER): Payer: Medicare HMO | Admitting: Podiatry

## 2019-11-07 DIAGNOSIS — M7989 Other specified soft tissue disorders: Secondary | ICD-10-CM

## 2019-11-07 DIAGNOSIS — M0630 Rheumatoid nodule, unspecified site: Secondary | ICD-10-CM

## 2019-11-07 DIAGNOSIS — T8131XA Disruption of external operation (surgical) wound, not elsewhere classified, initial encounter: Secondary | ICD-10-CM

## 2019-11-07 DIAGNOSIS — Z9889 Other specified postprocedural states: Secondary | ICD-10-CM

## 2019-11-07 NOTE — Progress Notes (Signed)
Subjective:  Patient ID: Taylor George, female    DOB: 1946/02/24,  MRN: [id number]  Chief Complaint  Patient presents with  . Routine Post Op    POV #3 DOS 10/09/19 LT FOOT EXCUSION OF SOFT TISSUE MASS/TUMOR     74 y.o. female returns for post-op check.  Patient is doing well.  Patient has been ambulating with a cam boot.  She states that the stitches are ready to come off.  There is a wound dehiscence noted with good granular wound bed.  She denies any other acute complaints.  She has not been getting it wet.  She is no clinical signs of infection  Review of Systems: Negative except as noted in the HPI. Denies N/V/F/Ch.  Past Medical History:  Diagnosis Date  . Arthritis    osteoporosis  . Asthma   . Cataract 01/2019   right eye  . Diabetes mellitus without complication ([place])   . GERD (gastroesophageal reflux disease)    ulcers  . History of hiatal hernia   . Hypercholesteremia   . Hypertension   . Pneumonia    bronchitis also, both in the past    Current Outpatient Medications:  .  Accu-Chek FastClix Lancets MISC, , Disp: , Rfl:  .  ACCU-CHEK SMARTVIEW test strip, , Disp: , Rfl:  .  albuterol (PROVENTIL) (2.5 MG/3ML) 0.083% nebulizer solution, 2.5 MG(3ML) VIA NEBULIZER EVERY 4 HRS IF NEEDED SHORTNESS OF BREATH, Disp: , Rfl:  .  budesonide-formoterol (SYMBICORT) 160-4.5 MCG/ACT inhaler, Inhale 2 puffs into the lungs 2 (two) times daily., Disp: , Rfl:  .  dexlansoprazole (DEXILANT) 60 MG capsule, Take 60 mg by mouth daily as needed., Disp: , Rfl:  .  diclofenac sodium (VOLTAREN) 1 % GEL, Apply topically 4 (four) times daily., Disp: , Rfl:  .  diclofenac Sodium (VOLTAREN) 1 % GEL, , Disp: , Rfl:  .  etanercept (ENBREL) 50 MG/ML injection, Inject 50 mg into the skin once a week., Disp: , Rfl:  .  fesoterodine (TOVIAZ) 8 MG TB24 tablet, Take 8 mg by mouth daily. At bedtime, Disp: , Rfl:  .  fluticasone (FLONASE) 50 MCG/ACT nasal spray, Place 2 sprays into both nostrils daily  as needed for allergies or rhinitis., Disp: , Rfl:  .  gabapentin (NEURONTIN) 600 MG tablet, Take 600 mg by mouth 2 (two) times daily. Lunch and bedtime, Disp: , Rfl:  .  ibuprofen (ADVIL) 800 MG tablet, Take 1 tablet (800 mg total) by mouth every 6 (six) hours as needed., Disp: 60 tablet, Rfl: 1 .  ketorolac (ACULAR) 0.4 % SOLN, PLEASE SEE ATTACHED FOR DETAILED DIRECTIONS, Disp: , Rfl:  .  leflunomide (ARAVA) 20 MG tablet, , Disp: , Rfl:  .  levocetirizine (XYZAL) 5 MG tablet, Take 5 mg by mouth daily after lunch., Disp: , Rfl:  .  LORazepam (ATIVAN) 0.5 MG tablet, Take 0.5 mg by mouth daily. In the morning, Disp: , Rfl:  .  meloxicam (MOBIC) 7.5 MG tablet, Take 7.5 mg by mouth daily. In the morning, Disp: , Rfl:  .  metFORMIN (GLUCOPHAGE) 500 MG tablet, Take by mouth daily after lunch., Disp: , Rfl:  .  metoprolol succinate (TOPROL-XL) 25 MG 24 hr tablet, Take 25 mg by mouth daily. At lunch, Disp: , Rfl:  .  mirabegron ER (MYRBETRIQ) 25 MG TB24 tablet, Take by mouth., Disp: , Rfl:  .  moxifloxacin (VIGAMOX) 0.5 % ophthalmic solution, Apply to eye., Disp: , Rfl:  .  oxyCODONE-acetaminophen (PERCOCET) 9-211  MG tablet, Take 1-2 tablets by mouth every 6 (six) hours as needed for severe pain., Disp: 30 tablet, Rfl: 0 .  pravastatin (PRAVACHOL) 20 MG tablet, Take 20 mg by mouth daily. With dinner, Disp: , Rfl:  .  prednisoLONE acetate (PRED FORTE) 1 % ophthalmic suspension, , Disp: , Rfl:  .  tetrahydrozoline-zinc (VISINE-AC) 0.05-0.25 % ophthalmic solution, 2 drops 2 (two) times daily., Disp: , Rfl:  .  traMADol (ULTRAM) 50 MG tablet, Take 50 mg by mouth daily as needed., Disp: , Rfl:   Social History   Tobacco Use  Smoking Status Current Every Day Smoker  . Types: Cigarettes  Smokeless Tobacco Never Used    Allergies  Allergen Reactions  . Ciprofloxacin Hives  . Penicillins Anaphylaxis  . Bee Venom Swelling    Bee stings cause her to swell up  . Red Maple (Acer Rubrum) Allergy Skin Test  Other (See Comments)    Red eyes, swollen  . Azithromycin Rash  . Keflex [Cephalexin] Rash   Objective:  There were no vitals filed for this visit. There is no height or weight on file to calculate BMI. Constitutional Well developed. Well nourished.  Vascular Foot warm and well perfused. Capillary refill normal to all digits.   Neurologic Normal speech. Oriented to person, place, and time. Epicritic sensation to light touch grossly present bilaterally.  Dermatologic  wound dehiscence noted down to granular healthy tissue.  Wound measurements were 3 cm x 1 cm x 0.3 cm  Orthopedic: Tenderness to palpation noted about the surgical site.   Radiographs: None Assessment:   1. Superficial dehiscence of operation wound, initial encounter   2. Mass of soft tissue of foot   3. Rheumatoid nodules (HCC)   4. Status post foot surgery    Plan:  Patient was evaluated and treated and all questions answered.  S/p foot surgery left -Progressing as expected post-operatively. -XR: None -WB Status: Weightbearing as tolerated in cam boot -Sutures: Removed.  Dehiscence noted with measurement listed above.  No clinical signs of infection noted.  Does not probe down to bone. -Medications: None Betadine wet-to-dry dressing changes.  I instructed her not to get the foot wet.    No follow-ups on file.

## 2019-11-26 ENCOUNTER — Other Ambulatory Visit: Payer: Self-pay | Admitting: Podiatry

## 2019-11-30 ENCOUNTER — Ambulatory Visit (INDEPENDENT_AMBULATORY_CARE_PROVIDER_SITE_OTHER): Payer: Medicare HMO | Admitting: Podiatry

## 2019-11-30 ENCOUNTER — Encounter: Payer: Self-pay | Admitting: Podiatry

## 2019-11-30 ENCOUNTER — Other Ambulatory Visit: Payer: Self-pay

## 2019-11-30 DIAGNOSIS — T8131XA Disruption of external operation (surgical) wound, not elsewhere classified, initial encounter: Secondary | ICD-10-CM

## 2019-12-01 ENCOUNTER — Encounter: Payer: Self-pay | Admitting: Podiatry

## 2019-12-01 NOTE — Progress Notes (Signed)
Subjective:  Patient ID: Taylor George, female    DOB: 1946-03-13,  MRN: [id number]  Chief Complaint  Patient presents with  . Routine Post Op    DOS 10/09/19 LT FOOT EXCUSION OF SOFT TISSUE MASS/TUMOR     74 y.o. female returns for post-op check.  Patient is doing well.  She states that the incision is getting better they have been doing Betadine wet-to-dry dressing changes there has not been any pain.  She denies any other acute complaints.  She has been keeping it dry.  She would like to know if she can get out of boot.  Review of Systems: Negative except as noted in the HPI. Denies N/V/F/Ch.  Past Medical History:  Diagnosis Date  . Arthritis    osteoporosis  . Asthma   . Cataract 01/2019   right eye  . Diabetes mellitus without complication ([place])   . GERD (gastroesophageal reflux disease)    ulcers  . History of hiatal hernia   . Hypercholesteremia   . Hypertension   . Pneumonia    bronchitis also, both in the past    Current Outpatient Medications:  .  Accu-Chek FastClix Lancets MISC, , Disp: , Rfl:  .  ACCU-CHEK SMARTVIEW test strip, , Disp: , Rfl:  .  albuterol (PROVENTIL) (2.5 MG/3ML) 0.083% nebulizer solution, 2.5 MG(3ML) VIA NEBULIZER EVERY 4 HRS IF NEEDED SHORTNESS OF BREATH, Disp: , Rfl:  .  budesonide-formoterol (SYMBICORT) 160-4.5 MCG/ACT inhaler, Inhale 2 puffs into the lungs 2 (two) times daily., Disp: , Rfl:  .  dexlansoprazole (DEXILANT) 60 MG capsule, Take 60 mg by mouth daily as needed., Disp: , Rfl:  .  diclofenac sodium (VOLTAREN) 1 % GEL, Apply topically 4 (four) times daily., Disp: , Rfl:  .  diclofenac Sodium (VOLTAREN) 1 % GEL, , Disp: , Rfl:  .  etanercept (ENBREL) 50 MG/ML injection, Inject 50 mg into the skin once a week., Disp: , Rfl:  .  fesoterodine (TOVIAZ) 8 MG TB24 tablet, Take 8 mg by mouth daily. At bedtime, Disp: , Rfl:  .  fluticasone (FLONASE) 50 MCG/ACT nasal spray, Place 2 sprays into both nostrils daily as needed for allergies or  rhinitis., Disp: , Rfl:  .  gabapentin (NEURONTIN) 600 MG tablet, Take 600 mg by mouth 2 (two) times daily. Lunch and bedtime, Disp: , Rfl:  .  ibuprofen (ADVIL) 800 MG tablet, TAKE 1 TABLET (800 MG TOTAL) BY MOUTH EVERY 6 (SIX) HOURS AS NEEDED., Disp: 60 tablet, Rfl: 1 .  ketorolac (ACULAR) 0.4 % SOLN, PLEASE SEE ATTACHED FOR DETAILED DIRECTIONS, Disp: , Rfl:  .  leflunomide (ARAVA) 20 MG tablet, , Disp: , Rfl:  .  levocetirizine (XYZAL) 5 MG tablet, Take 5 mg by mouth daily after lunch., Disp: , Rfl:  .  LORazepam (ATIVAN) 0.5 MG tablet, Take 0.5 mg by mouth daily. In the morning, Disp: , Rfl:  .  meloxicam (MOBIC) 7.5 MG tablet, Take 7.5 mg by mouth daily. In the morning, Disp: , Rfl:  .  metFORMIN (GLUCOPHAGE) 500 MG tablet, Take by mouth daily after lunch., Disp: , Rfl:  .  metoprolol succinate (TOPROL-XL) 25 MG 24 hr tablet, Take 25 mg by mouth daily. At lunch, Disp: , Rfl:  .  mirabegron ER (MYRBETRIQ) 25 MG TB24 tablet, Take by mouth., Disp: , Rfl:  .  moxifloxacin (VIGAMOX) 0.5 % ophthalmic solution, Apply to eye., Disp: , Rfl:  .  oxyCODONE-acetaminophen (PERCOCET) 5-325 MG tablet, Take 1-2 tablets by mouth  every 6 (six) hours as needed for severe pain., Disp: 30 tablet, Rfl: 0 .  pravastatin (PRAVACHOL) 20 MG tablet, Take 20 mg by mouth daily. With dinner, Disp: , Rfl:  .  prednisoLONE acetate (PRED FORTE) 1 % ophthalmic suspension, , Disp: , Rfl:  .  tetrahydrozoline-zinc (VISINE-AC) 0.05-0.25 % ophthalmic solution, 2 drops 2 (two) times daily., Disp: , Rfl:  .  traMADol (ULTRAM) 50 MG tablet, Take 50 mg by mouth daily as needed., Disp: , Rfl:   Social History   Tobacco Use  Smoking Status Current Every Day Smoker  . Types: Cigarettes  Smokeless Tobacco Never Used    Allergies  Allergen Reactions  . Ciprofloxacin Hives  . Penicillins Anaphylaxis  . Bee Venom Swelling    Bee stings cause her to swell up  . Red Maple (Acer Rubrum) Allergy Skin Test Other (See Comments)     Red eyes, swollen  . Azithromycin Rash  . Keflex [Cephalexin] Rash   Objective:  There were no vitals filed for this visit. There is no height or weight on file to calculate BMI. Constitutional Well developed. Well nourished.  Vascular Foot warm and well perfused. Capillary refill normal to all digits.   Neurologic Normal speech. Oriented to person, place, and time. Epicritic sensation to light touch grossly present bilaterally.  Dermatologic  wound dehiscence noted down to granular healthy tissue.  Wound measurements were 3 cm x 1 cm x 0.3 cm.  Granular wound decreasing  Orthopedic: Tenderness to palpation noted about the surgical site.   Radiographs: None Assessment:   1. Superficial dehiscence of operation wound, initial encounter    Plan:  Patient was evaluated and treated and all questions answered.  S/p foot surgery left -Progressing as expected post-operatively. -XR: None -WB Status: Weightbearing as tolerated in surgical shoe.  Surgical shoe was dispensed -Sutures: Removed.  Dehiscence noted with measurement listed above.  No clinical signs of infection noted.  Does not probe down to bone.  The wound is decreasing -Medications: None Betadine wet-to-dry dressing changes.  I instructed her not to get the foot wet.  Patient states understanding   No follow-ups on file.

## 2020-01-02 ENCOUNTER — Ambulatory Visit (INDEPENDENT_AMBULATORY_CARE_PROVIDER_SITE_OTHER): Payer: Medicare HMO | Admitting: Podiatry

## 2020-01-02 ENCOUNTER — Encounter: Payer: Self-pay | Admitting: Podiatry

## 2020-01-02 ENCOUNTER — Other Ambulatory Visit: Payer: Self-pay

## 2020-01-02 DIAGNOSIS — T8131XA Disruption of external operation (surgical) wound, not elsewhere classified, initial encounter: Secondary | ICD-10-CM

## 2020-01-02 DIAGNOSIS — Z9889 Other specified postprocedural states: Secondary | ICD-10-CM

## 2020-01-02 DIAGNOSIS — M7989 Other specified soft tissue disorders: Secondary | ICD-10-CM

## 2020-01-02 NOTE — Progress Notes (Signed)
Subjective:  Patient ID: Taylor George, female    DOB: 1945/05/04,  MRN: [id number]  Chief Complaint  Patient presents with  . Routine Post Op    DOS 10/09/19 LT FOOT EXCUSION OF SOFT TISSUE MASS/TUMOR     74 y.o. female returns for post-op check.  Patient is doing well.  She states that the incision is getting better they have been doing Betadine wet-to-dry dressing changes there has not been any pain.  She denies any other acute complaints.  She has been keeping it dry.  She has been wearing her surgical shoe  Review of Systems: Negative except as noted in the HPI. Denies N/V/F/Ch.  Past Medical History:  Diagnosis Date  . Arthritis    osteoporosis  . Asthma   . Cataract 01/2019   right eye  . Diabetes mellitus without complication ([place])   . GERD (gastroesophageal reflux disease)    ulcers  . History of hiatal hernia   . Hypercholesteremia   . Hypertension   . Pneumonia    bronchitis also, both in the past    Current Outpatient Medications:  .  Accu-Chek FastClix Lancets MISC, , Disp: , Rfl:  .  ACCU-CHEK SMARTVIEW test strip, , Disp: , Rfl:  .  albuterol (PROVENTIL) (2.5 MG/3ML) 0.083% nebulizer solution, 2.5 MG(3ML) VIA NEBULIZER EVERY 4 HRS IF NEEDED SHORTNESS OF BREATH, Disp: , Rfl:  .  budesonide-formoterol (SYMBICORT) 160-4.5 MCG/ACT inhaler, Inhale 2 puffs into the lungs 2 (two) times daily., Disp: , Rfl:  .  dexlansoprazole (DEXILANT) 60 MG capsule, Take 60 mg by mouth daily as needed., Disp: , Rfl:  .  diclofenac sodium (VOLTAREN) 1 % GEL, Apply topically 4 (four) times daily., Disp: , Rfl:  .  diclofenac Sodium (VOLTAREN) 1 % GEL, , Disp: , Rfl:  .  etanercept (ENBREL) 50 MG/ML injection, Inject 50 mg into the skin once a week., Disp: , Rfl:  .  fesoterodine (TOVIAZ) 8 MG TB24 tablet, Take 8 mg by mouth daily. At bedtime, Disp: , Rfl:  .  fluticasone (FLONASE) 50 MCG/ACT nasal spray, Place 2 sprays into both nostrils daily as needed for allergies or rhinitis.,  Disp: , Rfl:  .  gabapentin (NEURONTIN) 600 MG tablet, Take 600 mg by mouth 2 (two) times daily. Lunch and bedtime, Disp: , Rfl:  .  ibuprofen (ADVIL) 800 MG tablet, TAKE 1 TABLET (800 MG TOTAL) BY MOUTH EVERY 6 (SIX) HOURS AS NEEDED., Disp: 60 tablet, Rfl: 1 .  ketorolac (ACULAR) 0.4 % SOLN, PLEASE SEE ATTACHED FOR DETAILED DIRECTIONS, Disp: , Rfl:  .  leflunomide (ARAVA) 20 MG tablet, , Disp: , Rfl:  .  levocetirizine (XYZAL) 5 MG tablet, Take 5 mg by mouth daily after lunch., Disp: , Rfl:  .  LORazepam (ATIVAN) 0.5 MG tablet, Take 0.5 mg by mouth daily. In the morning, Disp: , Rfl:  .  meloxicam (MOBIC) 7.5 MG tablet, Take 7.5 mg by mouth daily. In the morning, Disp: , Rfl:  .  metFORMIN (GLUCOPHAGE) 500 MG tablet, Take by mouth daily after lunch., Disp: , Rfl:  .  metoprolol succinate (TOPROL-XL) 25 MG 24 hr tablet, Take 25 mg by mouth daily. At lunch, Disp: , Rfl:  .  mirabegron ER (MYRBETRIQ) 25 MG TB24 tablet, Take by mouth., Disp: , Rfl:  .  moxifloxacin (VIGAMOX) 0.5 % ophthalmic solution, Apply to eye., Disp: , Rfl:  .  oxyCODONE-acetaminophen (PERCOCET) 5-325 MG tablet, Take 1-2 tablets by mouth every 6 (six) hours as  needed for severe pain., Disp: 30 tablet, Rfl: 0 .  pravastatin (PRAVACHOL) 20 MG tablet, Take 20 mg by mouth daily. With dinner, Disp: , Rfl:  .  prednisoLONE acetate (PRED FORTE) 1 % ophthalmic suspension, , Disp: , Rfl:  .  tetrahydrozoline-zinc (VISINE-AC) 0.05-0.25 % ophthalmic solution, 2 drops 2 (two) times daily., Disp: , Rfl:  .  traMADol (ULTRAM) 50 MG tablet, Take 50 mg by mouth daily as needed., Disp: , Rfl:   Social History   Tobacco Use  Smoking Status Current Every Day Smoker  . Types: Cigarettes  Smokeless Tobacco Never Used    Allergies  Allergen Reactions  . Ciprofloxacin Hives  . Penicillins Anaphylaxis  . Bee Venom Swelling    Bee stings cause her to swell up  . Red Maple (Acer Rubrum) Allergy Skin Test Other (See Comments)    Red eyes,  swollen  . Azithromycin Rash  . Keflex [Cephalexin] Rash   Objective:  There were no vitals filed for this visit. There is no height or weight on file to calculate BMI. Constitutional Well developed. Well nourished.  Vascular Foot warm and well perfused. Capillary refill normal to all digits.   Neurologic Normal speech. Oriented to person, place, and time. Epicritic sensation to light touch grossly present bilaterally.  Dermatologic  wound dehiscence noted down to granular healthy tissue.  Wound measurements were 1 cm x 0.3 cm x 0.2 cm.  Granular wound decreasing  Orthopedic: Tenderness to palpation noted about the surgical site.   Radiographs: None Assessment:   1. Superficial dehiscence of operation wound, initial encounter   2. Mass of soft tissue of foot   3. Status post foot surgery    Plan:  Patient was evaluated and treated and all questions answered.  S/p foot surgery left -Progressing as expected post-operatively. -XR: None -WB Status: Weightbearing as tolerated in surgical shoe.  Surgical shoe was dispensed -Sutures: Removed.  Dehiscence noted with measurement listed above.  No clinical signs of infection noted.  Does not probe down to bone.  The wound is decreasing -Medications: None -patient can now transition Triple Antibiotic and a Band-Aid to keep it covered.  Decreasing as expected   No follow-ups on file.

## 2020-01-12 ENCOUNTER — Other Ambulatory Visit: Payer: Self-pay

## 2020-01-12 MED ORDER — IBUPROFEN 800 MG PO TABS: 800.0000 mg | ORAL_TABLET | Freq: Four times a day (QID) | ORAL | 1 refills | Status: AC | PRN

## 2020-01-12 NOTE — Telephone Encounter (Signed)
Patient called to request refill of Ibuprofen 800mg .  Per Dr. Serita Grit verbal order, ok to give refills   Script has been sent to pharmacy

## 2020-01-25 ENCOUNTER — Ambulatory Visit (INDEPENDENT_AMBULATORY_CARE_PROVIDER_SITE_OTHER): Payer: Medicare HMO | Admitting: Podiatry

## 2020-01-25 ENCOUNTER — Encounter: Payer: Self-pay | Admitting: Podiatry

## 2020-01-25 ENCOUNTER — Other Ambulatory Visit: Payer: Self-pay

## 2020-01-25 DIAGNOSIS — M7989 Other specified soft tissue disorders: Secondary | ICD-10-CM

## 2020-01-25 DIAGNOSIS — M0630 Rheumatoid nodule, unspecified site: Secondary | ICD-10-CM

## 2020-01-25 DIAGNOSIS — T8131XA Disruption of external operation (surgical) wound, not elsewhere classified, initial encounter: Secondary | ICD-10-CM | POA: Diagnosis not present

## 2020-01-25 NOTE — Progress Notes (Signed)
Subjective:  Patient ID: Taylor George, female    DOB: 1945-07-24,  MRN: [id number]  Chief Complaint  Patient presents with  . Wound Check    "its doing great"     74 y.o. female returns for post-op check.  Patient is doing well.  Incision has completely healed.  She denies any other acute complaints.  She has been wearing her surgical shoe  Review of Systems: Negative except as noted in the HPI. Denies N/V/F/Ch.  Past Medical History:  Diagnosis Date  . Arthritis    osteoporosis  . Asthma   . Cataract 01/2019   right eye  . Diabetes mellitus without complication ([place])   . GERD (gastroesophageal reflux disease)    ulcers  . History of hiatal hernia   . Hypercholesteremia   . Hypertension   . Pneumonia    bronchitis also, both in the past    Current Outpatient Medications:  .  Accu-Chek FastClix Lancets MISC, , Disp: , Rfl:  .  ACCU-CHEK SMARTVIEW test strip, , Disp: , Rfl:  .  albuterol (PROVENTIL) (2.5 MG/3ML) 0.083% nebulizer solution, 2.5 MG(3ML) VIA NEBULIZER EVERY 4 HRS IF NEEDED SHORTNESS OF BREATH, Disp: , Rfl:  .  budesonide-formoterol (SYMBICORT) 160-4.5 MCG/ACT inhaler, Inhale 2 puffs into the lungs 2 (two) times daily., Disp: , Rfl:  .  dexlansoprazole (DEXILANT) 60 MG capsule, Take 60 mg by mouth daily as needed., Disp: , Rfl:  .  diclofenac sodium (VOLTAREN) 1 % GEL, Apply topically 4 (four) times daily., Disp: , Rfl:  .  diclofenac Sodium (VOLTAREN) 1 % GEL, , Disp: , Rfl:  .  etanercept (ENBREL) 50 MG/ML injection, Inject 50 mg into the skin once a week., Disp: , Rfl:  .  fesoterodine (TOVIAZ) 8 MG TB24 tablet, Take 8 mg by mouth daily. At bedtime, Disp: , Rfl:  .  fluticasone (FLONASE) 50 MCG/ACT nasal spray, Place 2 sprays into both nostrils daily as needed for allergies or rhinitis., Disp: , Rfl:  .  gabapentin (NEURONTIN) 600 MG tablet, Take 600 mg by mouth 2 (two) times daily. Lunch and bedtime, Disp: , Rfl:  .  ibuprofen (ADVIL) 800 MG tablet, Take  1 tablet (800 mg total) by mouth every 6 (six) hours as needed., Disp: 60 tablet, Rfl: 1 .  ketorolac (ACULAR) 0.4 % SOLN, PLEASE SEE ATTACHED FOR DETAILED DIRECTIONS, Disp: , Rfl:  .  leflunomide (ARAVA) 20 MG tablet, , Disp: , Rfl:  .  levocetirizine (XYZAL) 5 MG tablet, Take 5 mg by mouth daily after lunch., Disp: , Rfl:  .  LORazepam (ATIVAN) 0.5 MG tablet, Take 0.5 mg by mouth daily. In the morning, Disp: , Rfl:  .  meloxicam (MOBIC) 7.5 MG tablet, Take 7.5 mg by mouth daily. In the morning, Disp: , Rfl:  .  metFORMIN (GLUCOPHAGE) 500 MG tablet, Take by mouth daily after lunch., Disp: , Rfl:  .  metoprolol succinate (TOPROL-XL) 25 MG 24 hr tablet, Take 25 mg by mouth daily. At lunch, Disp: , Rfl:  .  mirabegron ER (MYRBETRIQ) 25 MG TB24 tablet, Take by mouth., Disp: , Rfl:  .  moxifloxacin (VIGAMOX) 0.5 % ophthalmic solution, Apply to eye., Disp: , Rfl:  .  oxyCODONE-acetaminophen (PERCOCET) 5-325 MG tablet, Take 1-2 tablets by mouth every 6 (six) hours as needed for severe pain., Disp: 30 tablet, Rfl: 0 .  pravastatin (PRAVACHOL) 20 MG tablet, Take 20 mg by mouth daily. With dinner, Disp: , Rfl:  .  prednisoLONE acetate (  PRED FORTE) 1 % ophthalmic suspension, , Disp: , Rfl:  .  tetrahydrozoline-zinc (VISINE-AC) 0.05-0.25 % ophthalmic solution, 2 drops 2 (two) times daily., Disp: , Rfl:  .  traMADol (ULTRAM) 50 MG tablet, Take 50 mg by mouth daily as needed., Disp: , Rfl:   Social History   Tobacco Use  Smoking Status Current Every Day Smoker  . Types: Cigarettes  Smokeless Tobacco Never Used    Allergies  Allergen Reactions  . Ciprofloxacin Hives  . Penicillins Anaphylaxis  . Bee Venom Swelling    Bee stings cause her to swell up  . Red Maple (Acer Rubrum) Allergy Skin Test Other (See Comments)    Red eyes, swollen  . Azithromycin Rash  . Keflex [Cephalexin] Rash   Objective:  There were no vitals filed for this visit. There is no height or weight on file to calculate  BMI. Constitutional Well developed. Well nourished.  Vascular Foot warm and well perfused. Capillary refill normal to all digits.   Neurologic Normal speech. Oriented to person, place, and time. Epicritic sensation to light touch grossly present bilaterally.  Dermatologic  wound completely reepithelialized.  No signs of underlying wound noted.  No other signs of infection noted.  Orthopedic:  No tenderness to palpation noted about the surgical site.   Radiographs: None Assessment:   1. Superficial dehiscence of operation wound, initial encounter   2. Mass of soft tissue of foot   3. Rheumatoid nodules (HCC)    Plan:  Patient was evaluated and treated and all questions answered.  S/p foot surgery left -Progressing as expected post-operatively. -XR: None -WB Status: Weightbearing as tolerated in surgical shoe.  Surgical shoe was dispensed -Sutures: Removed.   -Medications: None -Patient can transition to regular sneakers without any restrictions.  The wound has completely reepithelialized   No follow-ups on file.

## 2020-12-24 ENCOUNTER — Ambulatory Visit: Payer: Medicare HMO | Admitting: Podiatry

## 2021-02-04 ENCOUNTER — Ambulatory Visit: Payer: Medicare HMO | Admitting: Podiatry

## 2021-04-10 ENCOUNTER — Other Ambulatory Visit: Payer: Self-pay | Admitting: Physician Assistant

## 2021-04-10 ENCOUNTER — Other Ambulatory Visit: Payer: Self-pay

## 2021-04-10 ENCOUNTER — Other Ambulatory Visit (HOSPITAL_COMMUNITY): Payer: Self-pay | Admitting: Physician Assistant

## 2021-04-10 ENCOUNTER — Ambulatory Visit: Payer: Medicare HMO | Admitting: Podiatry

## 2021-04-10 DIAGNOSIS — M778 Other enthesopathies, not elsewhere classified: Secondary | ICD-10-CM | POA: Diagnosis not present

## 2021-04-10 DIAGNOSIS — M542 Cervicalgia: Secondary | ICD-10-CM

## 2021-04-10 DIAGNOSIS — M0630 Rheumatoid nodule, unspecified site: Secondary | ICD-10-CM

## 2021-04-15 ENCOUNTER — Encounter: Payer: Self-pay | Admitting: Podiatry

## 2021-04-15 NOTE — Progress Notes (Signed)
Subjective:  Patient ID: Taylor George, female    DOB: 1946/03/12,  MRN: [id number]  Chief Complaint  Patient presents with   Callouses    Right foot hard place on the center of her foot     75 y.o. female presents with the above complaint.  Patient presents with new complaints of rheumatoid nodules to the left lateral heel as well as right posterior heel.  Patient states that it has been present for quite some time is progressive gotten worse.  The previous sites where I had removed rheumatoid nodules have not reoccurred.  She wanted get it evaluated.  She would like to discuss injections for now.  She is open to having it removed in the future.  She has not seen anyone else prior to seeing me for this.  She denies any other acute complaints.   Review of Systems: Negative except as noted in the HPI. Denies N/V/F/Ch.  Past Medical History:  Diagnosis Date   Arthritis    osteoporosis   Asthma    Cataract 01/2019   right eye   Diabetes mellitus without complication ([facility])    GERD (gastroesophageal reflux disease)    ulcers   History of hiatal hernia    Hypercholesteremia    Hypertension    Pneumonia    bronchitis also, both in the past    Current Outpatient Medications:    Accu-Chek FastClix Lancets MISC, , Disp: , Rfl:    ACCU-CHEK SMARTVIEW test strip, , Disp: , Rfl:    albuterol (PROVENTIL) (2.5 MG/3ML) 0.083% nebulizer solution, 2.5 MG(3ML) VIA NEBULIZER EVERY 4 HRS IF NEEDED SHORTNESS OF BREATH, Disp: , Rfl:    budesonide-formoterol (SYMBICORT) 160-4.5 MCG/ACT inhaler, Inhale 2 puffs into the lungs 2 (two) times daily., Disp: , Rfl:    dexlansoprazole (DEXILANT) 60 MG capsule, Take 60 mg by mouth daily as needed., Disp: , Rfl:    diclofenac sodium (VOLTAREN) 1 % GEL, Apply topically 4 (four) times daily., Disp: , Rfl:    diclofenac Sodium (VOLTAREN) 1 % GEL, , Disp: , Rfl:    etanercept (ENBREL) 50 MG/ML injection, Inject 50 mg into the skin once a week., Disp: , Rfl:     fesoterodine (TOVIAZ) 8 MG TB24 tablet, Take 8 mg by mouth daily. At bedtime, Disp: , Rfl:    fluticasone (FLONASE) 50 MCG/ACT nasal spray, Place 2 sprays into both nostrils daily as needed for allergies or rhinitis., Disp: , Rfl:    gabapentin (NEURONTIN) 600 MG tablet, Take 600 mg by mouth 2 (two) times daily. Lunch and bedtime, Disp: , Rfl:    ibuprofen (ADVIL) 800 MG tablet, Take 1 tablet (800 mg total) by mouth every 6 (six) hours as needed., Disp: 60 tablet, Rfl: 1   ketorolac (ACULAR) 0.4 % SOLN, PLEASE SEE ATTACHED FOR DETAILED DIRECTIONS, Disp: , Rfl:    leflunomide (ARAVA) 20 MG tablet, , Disp: , Rfl:    levocetirizine (XYZAL) 5 MG tablet, Take 5 mg by mouth daily after lunch., Disp: , Rfl:    LORazepam (ATIVAN) 0.5 MG tablet, Take 0.5 mg by mouth daily. In the morning, Disp: , Rfl:    meloxicam (MOBIC) 7.5 MG tablet, Take 7.5 mg by mouth daily. In the morning, Disp: , Rfl:    metFORMIN (GLUCOPHAGE) 500 MG tablet, Take by mouth daily after lunch., Disp: , Rfl:    metoprolol succinate (TOPROL-XL) 25 MG 24 hr tablet, Take 25 mg by mouth daily. At lunch, Disp: , Rfl:  mirabegron ER (MYRBETRIQ) 25 MG TB24 tablet, Take by mouth., Disp: , Rfl:    moxifloxacin (VIGAMOX) 0.5 % ophthalmic solution, Apply to eye., Disp: , Rfl:    pravastatin (PRAVACHOL) 20 MG tablet, Take 20 mg by mouth daily. With dinner, Disp: , Rfl:    prednisoLONE acetate (PRED FORTE) 1 % ophthalmic suspension, , Disp: , Rfl:    tetrahydrozoline-zinc (VISINE-AC) 0.05-0.25 % ophthalmic solution, 2 drops 2 (two) times daily., Disp: , Rfl:    traMADol (ULTRAM) 50 MG tablet, Take 50 mg by mouth daily as needed., Disp: , Rfl:   Social History   Tobacco Use  Smoking Status Every Day   Types: Cigarettes  Smokeless Tobacco Never    Allergies  Allergen Reactions   Ciprofloxacin Hives   Penicillins Anaphylaxis   Bee Venom Swelling    Bee stings cause her to swell up   Red Maple (Acer Rubrum) Allergy Skin Test Other (See  Comments)    Red eyes, swollen   Azithromycin Rash   Keflex [Cephalexin] Rash   Objective:  There were no vitals filed for this visit. There is no height or weight on file to calculate BMI. Constitutional Well developed. Well nourished.  Vascular Dorsalis pedis pulses palpable bilaterally. Posterior tibial pulses palpable bilaterally. Capillary refill normal to all digits.  No cyanosis or clubbing noted. Pedal hair growth normal.  Neurologic Normal speech. Oriented to person, place, and time. Epicritic sensation to light touch grossly present bilaterally.  Dermatologic Left lateral heel and right posterior heel rheumatoid nodule noted with hard indurated single lobulated mass.  No crepitus noted.  No fluctuance noted.  Negative transilluminates.  Orthopedic: Normal joint ROM without pain or crepitus bilaterally. No visible deformities. No bony tenderness.   Radiographs: None Assessment:   1. Rheumatoid nodules (HCC)   2. Capsulitis of right foot   3. Capsulitis of left foot    Plan:  Patient was evaluated and treated and all questions answered.  Bilateral heel rheumatoid nodules with underlying capsulitis -All questions and concerns were discussed with the patient in extensive detail -Given that this had recently started up I believe patient will benefit from steroid injection to help decrease the size of the rheumatoid nodule.  If there is no relief we will discuss surgical excision.  She states understanding.  She would like to proceed with a steroid injection. -A steroid injection was performed at bilateral heel using 1% plain Lidocaine and 10 mg of Kenalog. This was well tolerated.   No follow-ups on file.

## 2021-04-22 ENCOUNTER — Ambulatory Visit: Payer: Medicare HMO | Admitting: Podiatry

## 2021-04-29 ENCOUNTER — Ambulatory Visit
Admission: RE | Admit: 2021-04-29 | Discharge: 2021-04-29 | Disposition: A | Discharge status: Home / Self Care | Payer: Medicare HMO | Source: Ambulatory Visit | Attending: Physician Assistant | Admitting: Physician Assistant

## 2021-04-29 ENCOUNTER — Other Ambulatory Visit: Payer: Self-pay

## 2021-04-29 DIAGNOSIS — M542 Cervicalgia: Secondary | ICD-10-CM

## 2021-04-29 IMAGING — MR MR CERVICAL SPINE W/O CM
5 series · 40 of 48 positions shown · non-contrast
Comparison: None.

CLINICAL DATA: Neck pain radiating down left arm

EXAM:
MRI CERVICAL SPINE WITHOUT CONTRAST
TECHNIQUE: Multiplanar, multisequence MR imaging of the cervical spine was
performed. No intravenous contrast was administered.

[Series 5: T2 · sagittal · 3.0mm · 0.62mm/px · 6 of 15 slices shown (1 of 2)]
[im 1/15]
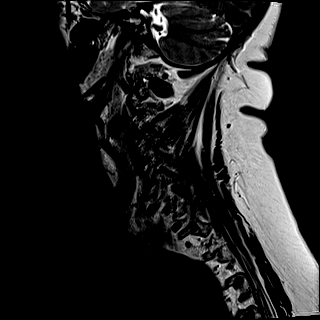
[im 3/15]
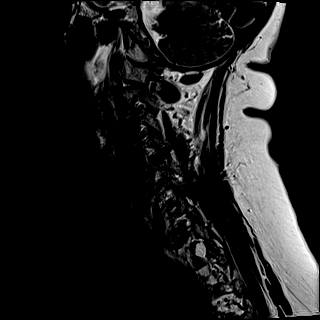
[im 6/15]
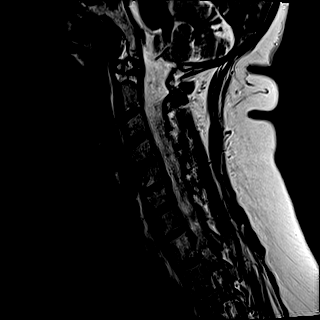
[im 9/15]
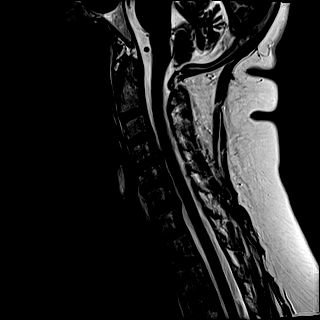
[im 12/15]
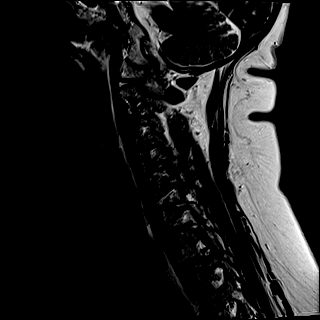
[im 15/15]
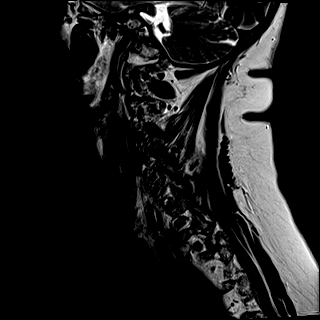

[Series 6: FLAIR · sagittal · 3.0mm · 0.78mm/px · 7 of 15 slices shown]
[im 1/15]
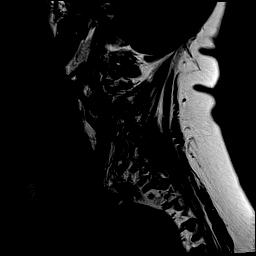
[im 3/15]
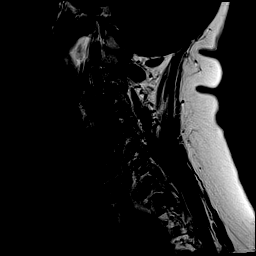
[im 5/15]
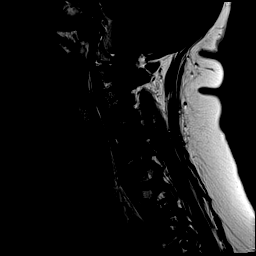
[im 8/15]
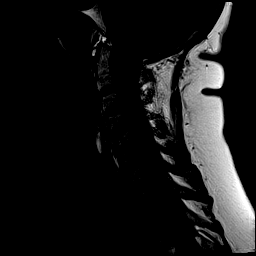
[im 10/15]
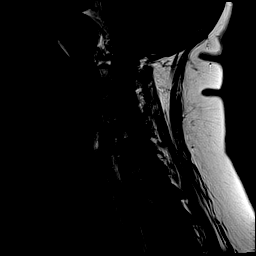
[im 12/15]
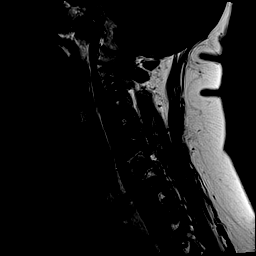
[im 15/15]
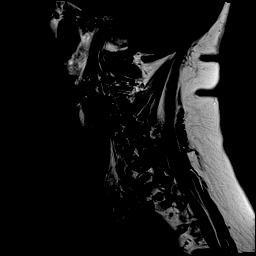

[Series 7: STIR · sagittal · 3.0mm · 0.62mm/px · 7 of 15 slices shown]
[im 1/15]
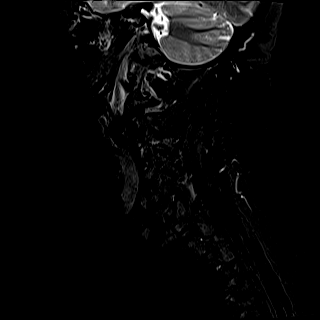
[im 3/15]
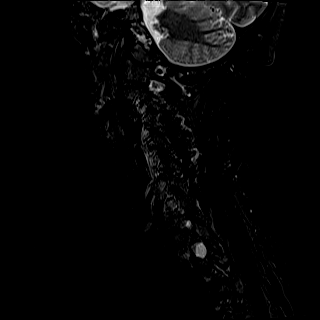
[im 5/15]
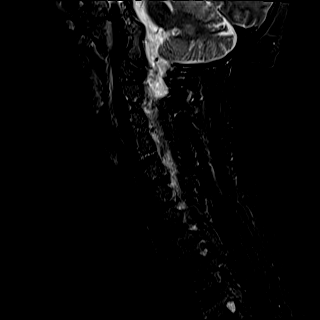
[im 8/15]
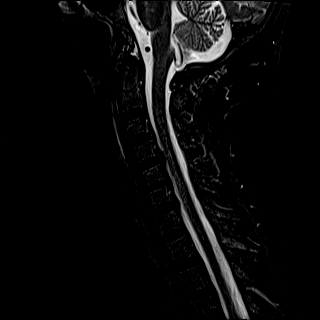
[im 10/15]
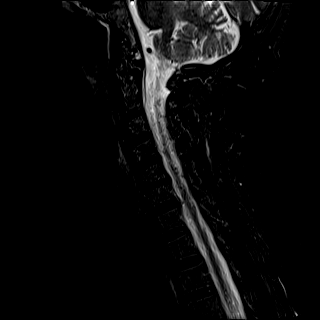
[im 12/15]
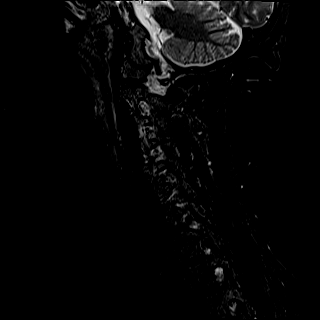
[im 15/15]
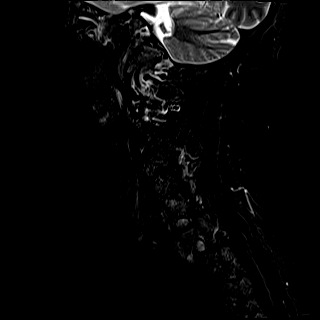

[Series 8: T2 · axial · 3.0mm · 0.70mm/px · z∈[-102,-5]mm · 12 of 29 slices shown (2 of 2)]
[im 1/29]
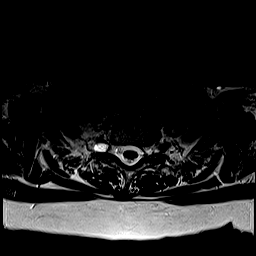
[im 3/29]
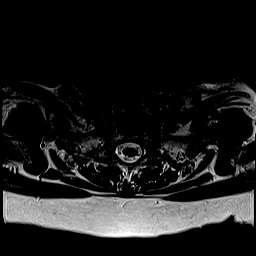
[im 5/29]
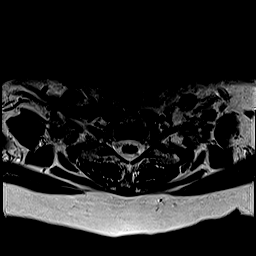
[im 7/29]
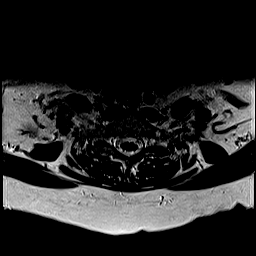
[im 9/29]
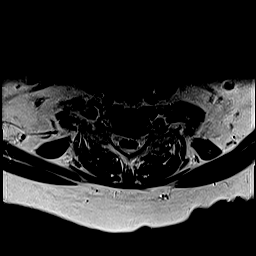
[im 11/29]
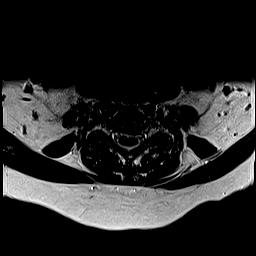
[im 13/29]
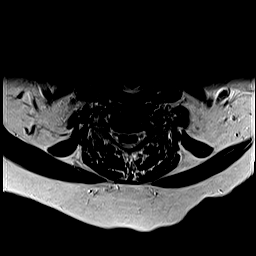
[im 16/29]
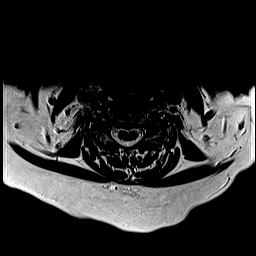
[im 18/29]
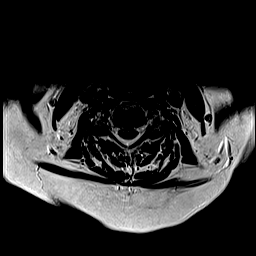
[im 20/29]
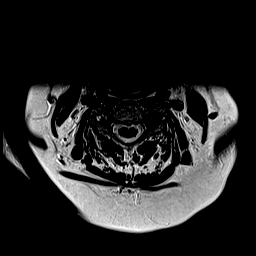
[im 24/29]
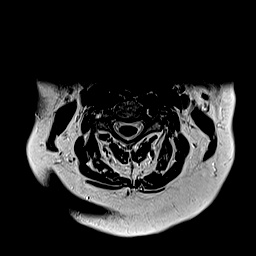
[im 29/29]
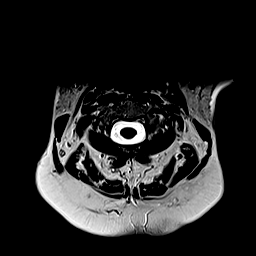

[Series 9: ax mpgr · axial · 3.0mm · 0.35mm/px · z∈[-102,-5]mm · 8 of 29 slices shown]
[im 1/29]
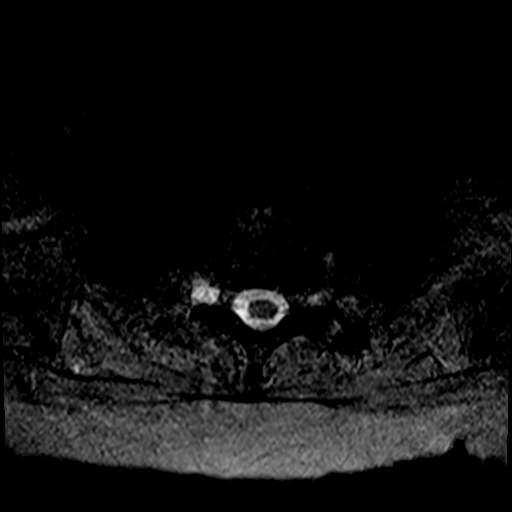
[im 5/29]
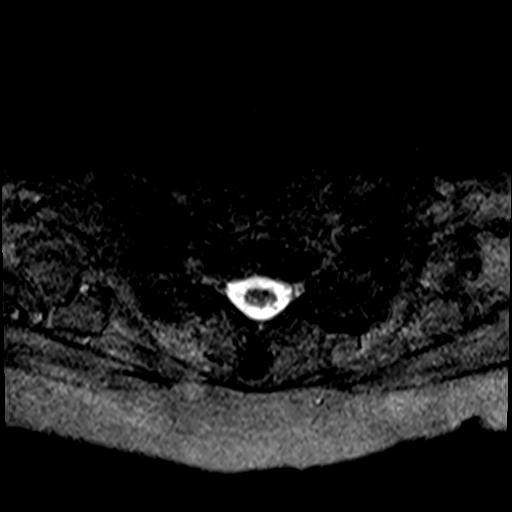
[im 9/29]
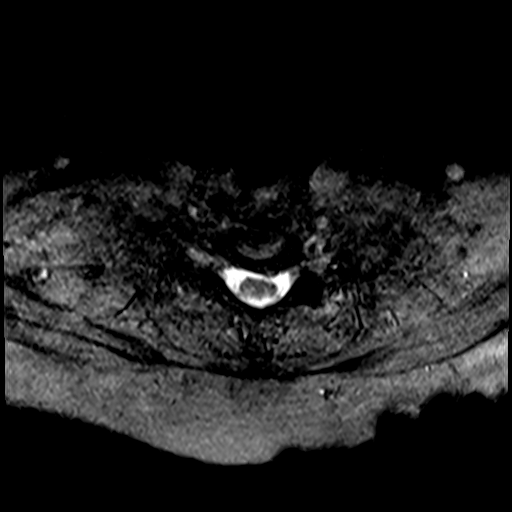
[im 13/29]
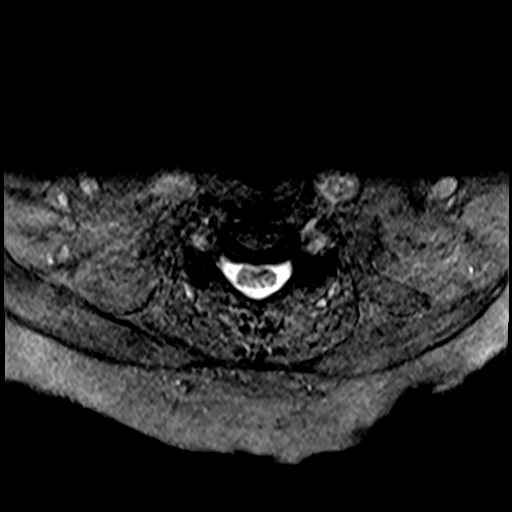
[im 16/29]
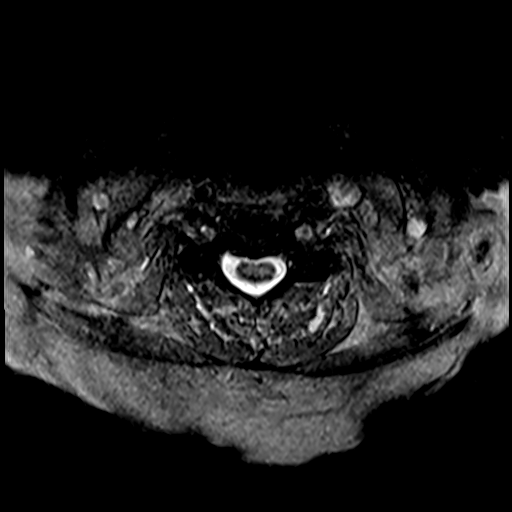
[im 20/29]
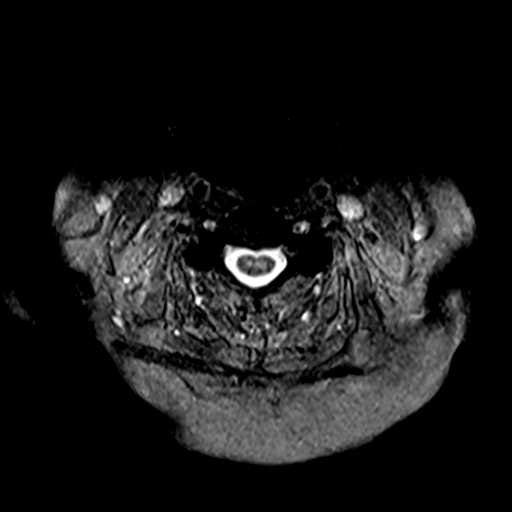
[im 24/29]
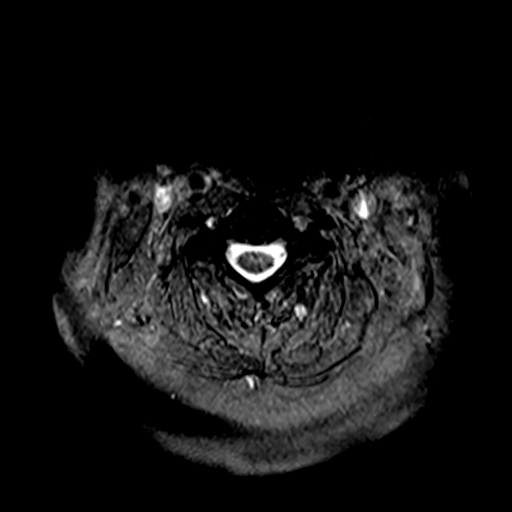
[im 29/29]
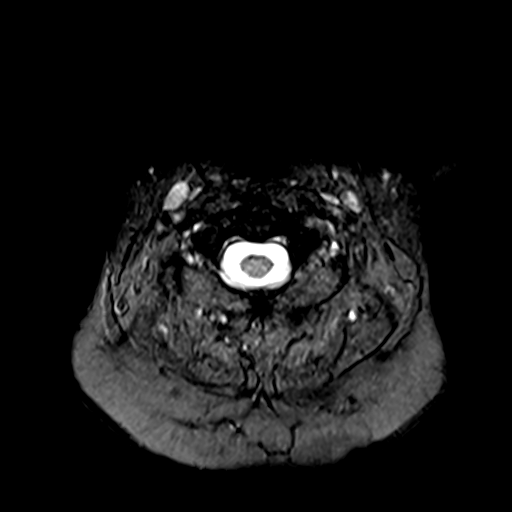

[40 of 48 positions shown; findings below may reference images not displayed]

FINDINGS: Alignment: Straightening of the normal cervical lordosis. No
listhesis.

Vertebrae: No acute fracture or suspicious osseous lesion.

Cord: Normal signal and morphology.

Posterior Fossa, vertebral arteries, paraspinal tissues: Negative.

Disc levels:

C2-C3: No significant disc bulge. Mild facet arthropathy. No spinal
canal stenosis or neural foraminal narrowing.

C3-C4: No significant disc bulge. Mild facet arthropathy. No spinal
canal stenosis or neural foraminal narrowing.

C4-C5: Small central disc protrusion, which abuts the ventral cord.
Mild facet arthropathy. No spinal canal stenosis or neural foraminal
narrowing.

C5-C6: No significant disc bulge. Right-greater-than-left facet and
uncovertebral hypertrophy. No spinal canal stenosis. Mild bilateral
neural foraminal narrowing.

C6-C7: Small left paracentral disc protrusion. Facet and
uncovertebral hypertrophy. No spinal canal stenosis. Mild left
neural foraminal narrowing.

C7-T1: No significant disc bulge. No spinal canal stenosis or
neuroforaminal narrowing.
IMPRESSION: 1. Mild neural foraminal narrowing bilaterally at C5-C6 and on the
left at C6-C7.
2. No spinal canal stenosis.

## 2021-06-12 ENCOUNTER — Ambulatory Visit: Payer: Medicare HMO | Admitting: Podiatry

## 2021-06-17 ENCOUNTER — Other Ambulatory Visit: Payer: Self-pay

## 2021-06-17 ENCOUNTER — Ambulatory Visit: Payer: Medicare HMO | Admitting: Podiatry

## 2021-06-17 ENCOUNTER — Ambulatory Visit: Payer: Medicare HMO

## 2021-06-17 DIAGNOSIS — M0630 Rheumatoid nodule, unspecified site: Secondary | ICD-10-CM

## 2021-06-17 DIAGNOSIS — M7989 Other specified soft tissue disorders: Secondary | ICD-10-CM | POA: Diagnosis not present

## 2021-06-17 DIAGNOSIS — Z01818 Encounter for other preprocedural examination: Secondary | ICD-10-CM | POA: Diagnosis not present

## 2021-06-17 DIAGNOSIS — T85848A Pain due to other internal prosthetic devices, implants and grafts, initial encounter: Secondary | ICD-10-CM

## 2021-06-19 ENCOUNTER — Telehealth: Payer: Self-pay

## 2021-06-19 NOTE — Telephone Encounter (Signed)
DOS 07/14/2021 ? ?REMOVAL FIXATION DEEP RT - 20680 ?PLANTAR FIBROMA RT - 28062 ? ?[employer] ? ?The following codes do not require a pre-authorization ? ?Created on 06/19/2021 ?Sex ?F ?DOB ?01/14/1946 ?Payer ?[employer] ?Membership type ?Medicare ?Plan Type ?MED ?Plan Year ?04/20/2021 - 04/19/9998 ?Group ID ?[id number] ?Service info ?[id number] ?Removal of implant; deep (eg, buried wire, pin, screw, metal band, nail, rod or plate) ?28062 ?Fasciectomy, plantar fascia; radical (separate procedure) ?

## 2021-06-24 NOTE — Progress Notes (Signed)
Subjective:  Patient ID: Taylor George, female    DOB: Sep 12, 1945,  MRN: [id number]  Chief Complaint  Patient presents with   Callouses    76 y.o. female presents with the above complaint.  Patient presents with complaint of right medial lateral arch plantar fibroma.  Patient states is painful to touch.  She states is painful while ambulating.  She states that it hurts.  The left side is doing well.  She also has secondary complaint of painful orthopedic hardware right foot.  She states the pain is pain full and started to hurt.  She would like to have that removed.  She had surgery on that 6 years ago has progressed gotten worse and is starting to hurt.  She states that she feels like she is stepping right on it.She has a history of subtalar joint fusion.  She has tried all conservative treatment options for both this is none of which has helped.  She is ready to have it surgically taken out.  Pain scale is 8 out of 10 hurts with ambulation   Review of Systems: Negative except as noted in the HPI. Denies N/V/F/Ch.  Past Medical History:  Diagnosis Date   Arthritis    osteoporosis   Asthma    Cataract 01/2019   right eye   Diabetes mellitus without complication (HCC)    GERD (gastroesophageal reflux disease)    ulcers   History of hiatal hernia    Hypercholesteremia    Hypertension    Pneumonia    bronchitis also, both in the past    Current Outpatient Medications:    Accu-Chek FastClix Lancets MISC, , Disp: , Rfl:    ACCU-CHEK SMARTVIEW test strip, , Disp: , Rfl:    albuterol (PROVENTIL) (2.5 MG/3ML) 0.083% nebulizer solution, 2.5 MG(3ML) VIA NEBULIZER EVERY 4 HRS IF NEEDED SHORTNESS OF BREATH, Disp: , Rfl:    budesonide-formoterol (SYMBICORT) 160-4.5 MCG/ACT inhaler, Inhale 2 puffs into the lungs 2 (two) times daily., Disp: , Rfl:    dexlansoprazole (DEXILANT) 60 MG capsule, Take 60 mg by mouth daily as needed., Disp: , Rfl:    diclofenac sodium (VOLTAREN) 1 % GEL, Apply  topically 4 (four) times daily., Disp: , Rfl:    diclofenac Sodium (VOLTAREN) 1 % GEL, , Disp: , Rfl:    etanercept (ENBREL) 50 MG/ML injection, Inject 50 mg into the skin once a week., Disp: , Rfl:    fesoterodine (TOVIAZ) 8 MG TB24 tablet, Take 8 mg by mouth daily. At bedtime, Disp: , Rfl:    fluticasone (FLONASE) 50 MCG/ACT nasal spray, Place 2 sprays into both nostrils daily as needed for allergies or rhinitis., Disp: , Rfl:    gabapentin (NEURONTIN) 600 MG tablet, Take 600 mg by mouth 2 (two) times daily. Lunch and bedtime, Disp: , Rfl:    ibuprofen (ADVIL) 800 MG tablet, Take 1 tablet (800 mg total) by mouth every 6 (six) hours as needed., Disp: 60 tablet, Rfl: 1   ketorolac (ACULAR) 0.4 % SOLN, PLEASE SEE ATTACHED FOR DETAILED DIRECTIONS, Disp: , Rfl:    leflunomide (ARAVA) 20 MG tablet, , Disp: , Rfl:    levocetirizine (XYZAL) 5 MG tablet, Take 5 mg by mouth daily after lunch., Disp: , Rfl:    LORazepam (ATIVAN) 0.5 MG tablet, Take 0.5 mg by mouth daily. In the morning, Disp: , Rfl:    meloxicam (MOBIC) 7.5 MG tablet, Take 7.5 mg by mouth daily. In the morning, Disp: , Rfl:    metFORMIN (  GLUCOPHAGE) 500 MG tablet, Take by mouth daily after lunch., Disp: , Rfl:    metoprolol succinate (TOPROL-XL) 25 MG 24 hr tablet, Take 25 mg by mouth daily. At lunch, Disp: , Rfl:    mirabegron ER (MYRBETRIQ) 25 MG TB24 tablet, Take by mouth., Disp: , Rfl:    moxifloxacin (VIGAMOX) 0.5 % ophthalmic solution, Apply to eye., Disp: , Rfl:    pravastatin (PRAVACHOL) 20 MG tablet, Take 20 mg by mouth daily. With dinner, Disp: , Rfl:    prednisoLONE acetate (PRED FORTE) 1 % ophthalmic suspension, , Disp: , Rfl:    tetrahydrozoline-zinc (VISINE-AC) 0.05-0.25 % ophthalmic solution, 2 drops 2 (two) times daily., Disp: , Rfl:    traMADol (ULTRAM) 50 MG tablet, Take 50 mg by mouth daily as needed., Disp: , Rfl:   Social History   Tobacco Use  Smoking Status Every Day   Types: Cigarettes  Smokeless Tobacco  Never    Allergies  Allergen Reactions   Ciprofloxacin Hives   Penicillins Anaphylaxis   Bee Venom Swelling    Bee stings cause her to swell up   Red Maple (Acer Rubrum) Allergy Skin Test Other (See Comments)    Red eyes, swollen   Azithromycin Rash   Keflex [Cephalexin] Rash   Objective:  There were no vitals filed for this visit. There is no height or weight on file to calculate BMI. Constitutional Well developed. Well nourished.  Vascular Dorsalis pedis pulses palpable bilaterally. Posterior tibial pulses palpable bilaterally. Capillary refill normal to all digits.  No cyanosis or clubbing noted. Pedal hair growth normal.  Neurologic Normal speech. Oriented to person, place, and time. Epicritic sensation to light touch grossly present bilaterally.  Dermatologic Nails well groomed and normal in appearance. No open wounds. No skin lesions.  Orthopedic: Pain on palpation to orthopedic hardware on top of the foot as well as on the bottom of the heel.  No backing out of the screw noted.  Right plantar fibroma noted to the medial arch as well as the lateral foot.  Pain on palpation to the fibroma.  They are well-circumscribed.  Multilobulated.  Hard in nature.   Radiographs: 3 views of skeletally mature the right foot: Previous orthopedic hardware noted crossing and spanning the subtalar joint.  Fusion is noted across the subtalar joint.  Increase in soft tissue density and volume noted to the plantar foot. Assessment:   1. Preop examination   2. Rheumatoid nodules ([place])   3. Mass of soft tissue of foot   4. Pain from implanted hardware, initial encounter    Plan:  Patient was evaluated and treated and all questions answered.  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  Given that she has failed conservative treatment options at home including shoe gear modification offloading.  She will  benefit from surgical excision of these fibromas.  I discussed my preoperative intraoperative postoperative plan in extensive detail.  She will be nonweightbearing to the right lower extremity given that these incisions will be on the bottom and they will be long.  I discussed with the patient she states understand would like to proceed with surgery.  Right painful orthopedic hardware -I explained to the patient the etiology of painful orthopedic hardware numbers treatment options were discussed.  At this time it appears on radiographically that her fusion has healed.  However if there is no sign of healing we will discuss other options.  I  will plan on removing the hardware as long as the fusion has taken place.  I discussed my preoperative intraoperative postoperative plan in extensive detail.  I discussed with her that given the age of these implants it may be very difficult to remove.  If it does not come out easily I will leave them in there.  She states understanding -CT scan was ordered to confirm.  I will go over this prior to the surgery. -Informed surgical risk consent was reviewed and read aloud to the patient.  I reviewed the films.  I have discussed my findings with the patient in great detail.  I have discussed all risks including but not limited to infection, stiffness, scarring, limp, disability, deformity, damage to blood vessels and nerves, numbness, poor healing, need for braces, arthritis, chronic pain, amputation, death.  All benefits and realistic expectations discussed in great detail.  I have made no promises as to the outcome.  I have provided realistic expectations.  I have offered the patient a 2nd opinion, which they have declined and assured me they preferred to proceed despite the risks   No follow-ups on file.

## 2021-07-02 ENCOUNTER — Other Ambulatory Visit: Payer: Medicare HMO

## 2021-07-07 ENCOUNTER — Ambulatory Visit
Admission: RE | Admit: 2021-07-07 | Discharge: 2021-07-07 | Disposition: A | Discharge status: Home / Self Care | Payer: Medicare HMO | Source: Ambulatory Visit | Attending: Podiatry | Admitting: Podiatry

## 2021-07-07 ENCOUNTER — Other Ambulatory Visit: Payer: Self-pay

## 2021-07-07 ENCOUNTER — Other Ambulatory Visit: Payer: Self-pay | Admitting: Urology

## 2021-07-07 DIAGNOSIS — T85848A Pain due to other internal prosthetic devices, implants and grafts, initial encounter: Secondary | ICD-10-CM

## 2021-07-14 ENCOUNTER — Other Ambulatory Visit: Payer: Self-pay | Admitting: Podiatry

## 2021-07-14 ENCOUNTER — Encounter: Payer: Self-pay | Admitting: Podiatry

## 2021-07-14 DIAGNOSIS — M722 Plantar fascial fibromatosis: Secondary | ICD-10-CM

## 2021-07-14 DIAGNOSIS — Z4889 Encounter for other specified surgical aftercare: Secondary | ICD-10-CM

## 2021-07-14 MED ORDER — OXYCODONE-ACETAMINOPHEN 5-325 MG PO TABS: 1.0000 | ORAL_TABLET | ORAL | 0 refills | Status: AC | PRN

## 2021-07-14 MED ORDER — IBUPROFEN 800 MG PO TABS: 800.0000 mg | ORAL_TABLET | Freq: Four times a day (QID) | ORAL | 1 refills | Status: AC | PRN

## 2021-07-22 ENCOUNTER — Ambulatory Visit (INDEPENDENT_AMBULATORY_CARE_PROVIDER_SITE_OTHER): Payer: Medicare HMO

## 2021-07-22 ENCOUNTER — Ambulatory Visit (INDEPENDENT_AMBULATORY_CARE_PROVIDER_SITE_OTHER): Payer: Medicare HMO | Admitting: Podiatry

## 2021-07-22 DIAGNOSIS — T85848A Pain due to other internal prosthetic devices, implants and grafts, initial encounter: Secondary | ICD-10-CM

## 2021-07-22 DIAGNOSIS — Z9889 Other specified postprocedural states: Secondary | ICD-10-CM

## 2021-07-22 DIAGNOSIS — M0630 Rheumatoid nodule, unspecified site: Secondary | ICD-10-CM

## 2021-07-22 DIAGNOSIS — M7989 Other specified soft tissue disorders: Secondary | ICD-10-CM

## 2021-07-24 ENCOUNTER — Encounter: Payer: Self-pay | Admitting: Podiatry

## 2021-07-24 NOTE — Progress Notes (Signed)
?Subjective:  ?Patient ID: Taylor George, female    DOB: [birth year],  MRN: [id number] ? ?No chief complaint on file. ? ? ?DOS: 07/14/2021 ?Procedure: Hardware removal and excision of 2 rheumatoid nodules ? ?76 y.o. female returns for post-op check.  Patient states that she is doing well.  No acute complaint.  Minimal pain.  Bandages clean dry and intact.  No nausea fever chills vomiting. ? ?Review of Systems: Negative except as noted in the HPI. Denies N/V/F/Ch. ? ?Past Medical History:  ?Diagnosis Date  ? Arthritis   ? osteoporosis  ? Asthma   ? Cataract 01/2019  ? right eye  ? Diabetes mellitus without complication ([place])   ? GERD (gastroesophageal reflux disease)   ? ulcers  ? History of hiatal hernia   ? Hypercholesteremia   ? Hypertension   ? Pneumonia   ? bronchitis also, both in the past  ? ? ?Current Outpatient Medications:  ?  Accu-Chek FastClix Lancets MISC, , Disp: , Rfl:  ?  ACCU-CHEK SMARTVIEW test strip, , Disp: , Rfl:  ?  albuterol (PROVENTIL) (2.5 MG/3ML) 0.083% nebulizer solution, 2.5 MG(3ML) VIA NEBULIZER EVERY 4 HRS IF NEEDED SHORTNESS OF BREATH, Disp: , Rfl:  ?  budesonide-formoterol (SYMBICORT) 160-4.5 MCG/ACT inhaler, Inhale 2 puffs into the lungs 2 (two) times daily., Disp: , Rfl:  ?  dexlansoprazole (DEXILANT) 60 MG capsule, Take 60 mg by mouth daily as needed., Disp: , Rfl:  ?  diclofenac sodium (VOLTAREN) 1 % GEL, Apply topically 4 (four) times daily., Disp: , Rfl:  ?  diclofenac Sodium (VOLTAREN) 1 % GEL, , Disp: , Rfl:  ?  etanercept (ENBREL) 50 MG/ML injection, Inject 50 mg into the skin once a week., Disp: , Rfl:  ?  fesoterodine (TOVIAZ) 8 MG TB24 tablet, Take 8 mg by mouth daily. At bedtime, Disp: , Rfl:  ?  fluticasone (FLONASE) 50 MCG/ACT nasal spray, Place 2 sprays into both nostrils daily as needed for allergies or rhinitis., Disp: , Rfl:  ?  gabapentin (NEURONTIN) 600 MG tablet, Take 600 mg by mouth 2 (two) times daily. Lunch and bedtime, Disp: , Rfl:  ?  ibuprofen (ADVIL) 800  MG tablet, Take 1 tablet (800 mg total) by mouth every 6 (six) hours as needed., Disp: 60 tablet, Rfl: 1 ?  ibuprofen (ADVIL) 800 MG tablet, Take 1 tablet (800 mg total) by mouth every 6 (six) hours as needed., Disp: 60 tablet, Rfl: 1 ?  ketorolac (ACULAR) 0.4 % SOLN, PLEASE SEE ATTACHED FOR DETAILED DIRECTIONS, Disp: , Rfl:  ?  leflunomide (ARAVA) 20 MG tablet, , Disp: , Rfl:  ?  levocetirizine (XYZAL) 5 MG tablet, Take 5 mg by mouth daily after lunch., Disp: , Rfl:  ?  LORazepam (ATIVAN) 0.5 MG tablet, Take 0.5 mg by mouth daily. In the morning, Disp: , Rfl:  ?  meloxicam (MOBIC) 7.5 MG tablet, Take 7.5 mg by mouth daily. In the morning, Disp: , Rfl:  ?  metFORMIN (GLUCOPHAGE) 500 MG tablet, Take by mouth daily after lunch., Disp: , Rfl:  ?  metoprolol succinate (TOPROL-XL) 25 MG 24 hr tablet, Take 25 mg by mouth daily. At lunch, Disp: , Rfl:  ?  mirabegron ER (MYRBETRIQ) 25 MG TB24 tablet, Take by mouth., Disp: , Rfl:  ?  moxifloxacin (VIGAMOX) 0.5 % ophthalmic solution, Apply to eye., Disp: , Rfl:  ?  oxyCODONE-acetaminophen (PERCOCET) 5-325 MG tablet, Take 1 tablet by mouth every 4 (four) hours as needed for severe pain., Disp:  30 tablet, Rfl: 0 ?  pravastatin (PRAVACHOL) 20 MG tablet, Take 20 mg by mouth daily. With dinner, Disp: , Rfl:  ?  prednisoLONE acetate (PRED FORTE) 1 % ophthalmic suspension, , Disp: , Rfl:  ?  tetrahydrozoline-zinc (VISINE-AC) 0.05-0.25 % ophthalmic solution, 2 drops 2 (two) times daily., Disp: , Rfl:  ?  traMADol (ULTRAM) 50 MG tablet, Take 50 mg by mouth daily as needed., Disp: , Rfl:  ? ?Social History  ? ?Tobacco Use  ?Smoking Status Every Day  ? Types: Cigarettes  ?Smokeless Tobacco Never  ? ? ?Allergies  ?Allergen Reactions  ? Ciprofloxacin Hives  ? Penicillins Anaphylaxis  ? Bee Venom Swelling  ?  Bee stings cause her to swell up  ? Red Maple (Acer Rubrum) Allergy Skin Test Other (See Comments)  ?  Red eyes, swollen  ? Azithromycin Rash  ? Keflex [Cephalexin] Rash   ? ?Objective:  ?There were no vitals filed for this visit. ?There is no height or weight on file to calculate BMI. ?Constitutional Well developed. ?Well nourished.  ?Vascular Foot warm and well perfused. ?Capillary refill normal to all digits.   ?Neurologic Normal speech. ?Oriented to person, place, and time. ?Epicritic sensation to light touch grossly present bilaterally.  ?Dermatologic Skin healing well without signs of infection. Skin edges well coapted without signs of infection.  ?Orthopedic: Tenderness to palpation noted about the surgical site.  ? ?Radiographs: 3 views of skeletally mature adult right foot: Osseous fusion noted across the subtalar joint no hardware noted.  The hardware was removed.  No other bony abnormalities identified ?Assessment:  ? ?1. Pain from implanted hardware, initial encounter   ?2. Rheumatoid nodules (HCC)   ?3. Mass of soft tissue of foot   ?4. Status post foot surgery   ? ?Plan:  ?Patient was evaluated and treated and all questions answered. ? ?S/p foot surgery right ?-Progressing as expected post-operatively. ?-XR: See above ?-WB Status: Weightbearing to the right lower extremity in crutches ?-Sutures: Intact.  No clinical signs of Deis is noted no complication noted. ?-Medications: None ?-Foot redressed. ? ?No follow-ups on file.  ?

## 2021-07-30 ENCOUNTER — Other Ambulatory Visit: Payer: Self-pay | Admitting: Podiatry

## 2021-08-01 ENCOUNTER — Other Ambulatory Visit: Payer: Self-pay | Admitting: Podiatry

## 2021-08-04 ENCOUNTER — Telehealth: Payer: Self-pay | Admitting: *Deleted

## 2021-08-04 NOTE — Telephone Encounter (Signed)
"  I'm calling about a refill for Oxycodone.  [facility] ?

## 2021-08-05 ENCOUNTER — Encounter: Payer: Medicare HMO | Admitting: Podiatry

## 2021-08-05 MED ORDER — OXYCODONE-ACETAMINOPHEN 5-325 MG PO TABS: 1.0000 | ORAL_TABLET | ORAL | 0 refills | Status: DC | PRN

## 2021-08-05 NOTE — Addendum Note (Signed)
Addended by: Boneta Lucks on: 08/05/2021 07:55 AM ? ? Modules accepted: Orders ? ?

## 2021-08-14 ENCOUNTER — Encounter: Payer: Medicare HMO | Admitting: Podiatry

## 2021-08-26 ENCOUNTER — Ambulatory Visit (INDEPENDENT_AMBULATORY_CARE_PROVIDER_SITE_OTHER): Payer: Medicare HMO | Admitting: Podiatry

## 2021-08-26 DIAGNOSIS — T85848A Pain due to other internal prosthetic devices, implants and grafts, initial encounter: Secondary | ICD-10-CM

## 2021-08-26 DIAGNOSIS — M0630 Rheumatoid nodule, unspecified site: Secondary | ICD-10-CM

## 2021-08-26 DIAGNOSIS — Z9889 Other specified postprocedural states: Secondary | ICD-10-CM

## 2021-08-26 DIAGNOSIS — M7989 Other specified soft tissue disorders: Secondary | ICD-10-CM

## 2021-08-26 NOTE — Progress Notes (Signed)
?Subjective:  ?Patient ID: Taylor George, female    DOB: 11-Jul-1945,  MRN: [id number] ? ?Chief Complaint  ?Patient presents with  ? Routine Post Op  ?   POV #2 DOS 07/14/2021 RT REMOVAL OF HARDWARE PAINFUL AND EXCISION OF PLANTAR FIBROMA  ? ? ?DOS: 07/14/2021 ?Procedure: Hardware removal and excision of 2 rheumatoid nodules ? ?76 y.o. female returns for post-op check.  Patient states that she is doing well.  No acute complaint.  Minimal pain.  Bandages clean dry and intact.  No nausea fever chills vomiting. ? ?Review of Systems: Negative except as noted in the HPI. Denies N/V/F/Ch. ? ?Past Medical History:  ?Diagnosis Date  ? Arthritis   ? osteoporosis  ? Asthma   ? Cataract 01/2019  ? right eye  ? Diabetes mellitus without complication ([place])   ? GERD (gastroesophageal reflux disease)   ? ulcers  ? History of hiatal hernia   ? Hypercholesteremia   ? Hypertension   ? Pneumonia   ? bronchitis also, both in the past  ? ? ?Current Outpatient Medications:  ?  Accu-Chek FastClix Lancets MISC, , Disp: , Rfl:  ?  ACCU-CHEK SMARTVIEW test strip, , Disp: , Rfl:  ?  albuterol (PROVENTIL) (2.5 MG/3ML) 0.083% nebulizer solution, 2.5 MG(3ML) VIA NEBULIZER EVERY 4 HRS IF NEEDED SHORTNESS OF BREATH, Disp: , Rfl:  ?  budesonide-formoterol (SYMBICORT) 160-4.5 MCG/ACT inhaler, Inhale 2 puffs into the lungs 2 (two) times daily., Disp: , Rfl:  ?  dexlansoprazole (DEXILANT) 60 MG capsule, Take 60 mg by mouth daily as needed., Disp: , Rfl:  ?  diclofenac sodium (VOLTAREN) 1 % GEL, Apply topically 4 (four) times daily., Disp: , Rfl:  ?  diclofenac Sodium (VOLTAREN) 1 % GEL, , Disp: , Rfl:  ?  etanercept (ENBREL) 50 MG/ML injection, Inject 50 mg into the skin once a week., Disp: , Rfl:  ?  fesoterodine (TOVIAZ) 8 MG TB24 tablet, Take 8 mg by mouth daily. At bedtime, Disp: , Rfl:  ?  fluticasone (FLONASE) 50 MCG/ACT nasal spray, Place 2 sprays into both nostrils daily as needed for allergies or rhinitis., Disp: , Rfl:  ?  gabapentin  (NEURONTIN) 600 MG tablet, Take 600 mg by mouth 2 (two) times daily. Lunch and bedtime, Disp: , Rfl:  ?  ibuprofen (ADVIL) 800 MG tablet, Take 1 tablet (800 mg total) by mouth every 6 (six) hours as needed., Disp: 60 tablet, Rfl: 1 ?  ibuprofen (ADVIL) 800 MG tablet, Take 1 tablet (800 mg total) by mouth every 6 (six) hours as needed., Disp: 60 tablet, Rfl: 1 ?  ketorolac (ACULAR) 0.4 % SOLN, PLEASE SEE ATTACHED FOR DETAILED DIRECTIONS, Disp: , Rfl:  ?  leflunomide (ARAVA) 20 MG tablet, , Disp: , Rfl:  ?  levocetirizine (XYZAL) 5 MG tablet, Take 5 mg by mouth daily after lunch., Disp: , Rfl:  ?  LORazepam (ATIVAN) 0.5 MG tablet, Take 0.5 mg by mouth daily. In the morning, Disp: , Rfl:  ?  meloxicam (MOBIC) 7.5 MG tablet, Take 7.5 mg by mouth daily. In the morning, Disp: , Rfl:  ?  metFORMIN (GLUCOPHAGE) 500 MG tablet, Take by mouth daily after lunch., Disp: , Rfl:  ?  metoprolol succinate (TOPROL-XL) 25 MG 24 hr tablet, Take 25 mg by mouth daily. At lunch, Disp: , Rfl:  ?  mirabegron ER (MYRBETRIQ) 25 MG TB24 tablet, Take by mouth., Disp: , Rfl:  ?  moxifloxacin (VIGAMOX) 0.5 % ophthalmic solution, Apply to eye., Disp: ,  Rfl:  ?  oxyCODONE-acetaminophen (PERCOCET) 5-325 MG tablet, Take 1 tablet by mouth every 4 (four) hours as needed for severe pain., Disp: 30 tablet, Rfl: 0 ?  oxyCODONE-acetaminophen (PERCOCET) 5-325 MG tablet, Take 1 tablet by mouth every 4 (four) hours as needed for severe pain., Disp: 30 tablet, Rfl: 0 ?  pravastatin (PRAVACHOL) 20 MG tablet, Take 20 mg by mouth daily. With dinner, Disp: , Rfl:  ?  prednisoLONE acetate (PRED FORTE) 1 % ophthalmic suspension, , Disp: , Rfl:  ?  tetrahydrozoline-zinc (VISINE-AC) 0.05-0.25 % ophthalmic solution, 2 drops 2 (two) times daily., Disp: , Rfl:  ?  traMADol (ULTRAM) 50 MG tablet, Take 50 mg by mouth daily as needed., Disp: , Rfl:  ? ?Social History  ? ?Tobacco Use  ?Smoking Status Every Day  ? Types: Cigarettes  ?Smokeless Tobacco Never  ? ? ?Allergies   ?Allergen Reactions  ? Ciprofloxacin Hives  ? Penicillins Anaphylaxis  ? Bee Venom Swelling  ?  Bee stings cause her to swell up  ? Red Maple (Acer Rubrum) Allergy Skin Test Other (See Comments)  ?  Red eyes, swollen  ? Azithromycin Rash  ? Keflex [Cephalexin] Rash  ? ?Objective:  ?There were no vitals filed for this visit. ?There is no height or weight on file to calculate BMI. ?Constitutional Well developed. ?Well nourished.  ?Vascular Foot warm and well perfused. ?Capillary refill normal to all digits.   ?Neurologic Normal speech. ?Oriented to person, place, and time. ?Epicritic sensation to light touch grossly present bilaterally.  ?Dermatologic Skin completely well reepithelialized.  No clinical signs of infection noted no complication noted.  ?Orthopedic: No tenderness to palpation noted about the surgical site.  ? ?Radiographs: 3 views of skeletally mature adult right foot: Osseous fusion noted across the subtalar joint no hardware noted.  The hardware was removed.  No other bony abnormalities identified ?Assessment:  ? ?1. Pain from implanted hardware, initial encounter   ?2. Rheumatoid nodules (HCC)   ?3. Mass of soft tissue of foot   ?4. Status post foot surgery   ? ? ?Plan:  ?Patient was evaluated and treated and all questions answered. ? ?S/p foot surgery right ?-  Patient skin has completely epithelialized.  6 sutures were removed.  No signs of Deis is noted.  Patient can return to regular shoe gear and activities without any restriction or reservation.  She states understanding ? ?No follow-ups on file.  ?

## 2021-08-27 ENCOUNTER — Other Ambulatory Visit: Payer: Self-pay | Admitting: Podiatry

## 2021-08-29 MED ORDER — OXYCODONE-ACETAMINOPHEN 5-325 MG PO TABS
1.0000 | ORAL_TABLET | ORAL | 0 refills | Status: AC | PRN
Start: 2021-08-29 — End: ?

## 2021-11-04 ENCOUNTER — Ambulatory Visit: Payer: Medicare HMO | Admitting: Podiatry
